# Patient Record
Sex: Female | Born: 1990 | Race: Black or African American | Hispanic: No | Marital: Single | Smoking: Never smoker
Health system: Southern US, Community
[De-identification: ages and names within clinical notes are randomized; demographics above are authoritative.]

## PROBLEM LIST (undated history)

## (undated) DIAGNOSIS — Z309 Encounter for contraceptive management, unspecified: Secondary | ICD-10-CM

## (undated) DIAGNOSIS — B9689 Other specified bacterial agents as the cause of diseases classified elsewhere: Secondary | ICD-10-CM

## (undated) DIAGNOSIS — N760 Acute vaginitis: Secondary | ICD-10-CM

## (undated) DIAGNOSIS — Z3490 Encounter for supervision of normal pregnancy, unspecified, unspecified trimester: Secondary | ICD-10-CM

## (undated) DIAGNOSIS — N898 Other specified noninflammatory disorders of vagina: Secondary | ICD-10-CM

## (undated) DIAGNOSIS — R87629 Unspecified abnormal cytological findings in specimens from vagina: Secondary | ICD-10-CM

## (undated) DIAGNOSIS — A549 Gonococcal infection, unspecified: Secondary | ICD-10-CM

## (undated) HISTORY — DX: Other specified noninflammatory disorders of vagina: N89.8

## (undated) HISTORY — DX: Encounter for supervision of normal pregnancy, unspecified, unspecified trimester: Z34.90

## (undated) HISTORY — DX: Other specified bacterial agents as the cause of diseases classified elsewhere: B96.89

## (undated) HISTORY — DX: Encounter for contraceptive management, unspecified: Z30.9

## (undated) HISTORY — DX: Unspecified abnormal cytological findings in specimens from vagina: R87.629

## (undated) HISTORY — DX: Gonococcal infection, unspecified: A54.9

## (undated) HISTORY — DX: Acute vaginitis: N76.0

## (undated) HISTORY — PX: KELOID EXCISION: SHX1856

---

## 2011-04-05 ENCOUNTER — Encounter (HOSPITAL_COMMUNITY): Payer: Self-pay | Admitting: *Deleted

## 2011-04-05 ENCOUNTER — Emergency Department (HOSPITAL_COMMUNITY): Payer: Self-pay

## 2011-04-05 ENCOUNTER — Emergency Department (HOSPITAL_COMMUNITY)
Admission: EM | Admit: 2011-04-05 | Discharge: 2011-04-05 | Disposition: A | Discharge status: Home / Self Care | Payer: Self-pay | Attending: Emergency Medicine | Admitting: Emergency Medicine

## 2011-04-05 DIAGNOSIS — W010XXA Fall on same level from slipping, tripping and stumbling without subsequent striking against object, initial encounter: Secondary | ICD-10-CM | POA: Insufficient documentation

## 2011-04-05 DIAGNOSIS — S63602A Unspecified sprain of left thumb, initial encounter: Secondary | ICD-10-CM

## 2011-04-05 DIAGNOSIS — S6390XA Sprain of unspecified part of unspecified wrist and hand, initial encounter: Secondary | ICD-10-CM | POA: Insufficient documentation

## 2011-04-05 DIAGNOSIS — M79609 Pain in unspecified limb: Secondary | ICD-10-CM | POA: Insufficient documentation

## 2011-04-05 NOTE — ED Notes (Signed)
Pt. Reported fell and landed on left hand and hurt thumb. She's put ice on the thumb.

## 2011-04-05 NOTE — ED Notes (Signed)
Patient transported to X-ray 

## 2011-04-05 NOTE — ED Notes (Signed)
Ortho called for a thumb spica

## 2011-04-05 NOTE — Discharge Instructions (Signed)
Your x-rays were normal and did not show signs of any broken bones. This is most likely a sprain of the ligaments of your thumb. We have placed in a splint to give you some extra support. Use this for the next week. You may use up to 800 mg ibuprofen 3 times a day for pain as needed. Take this with food. Continue using ice to the area and elevating the hand.If you're having persistent pain, trouble moving the thumb, or weakness in the thumb in 3-4 days please call and make a followup appointment with the orthopedic surgeon listed above.  Sprains Sprains are painful injuries to joints as a result of partial or complete tearing of ligaments. HOME CARE INSTRUCTIONS   For the first 24 hours, keep the injured limb raised on 2 pillows while lying down.   Apply ice bags about every 2 hours for 20 to 30 minutes, while awake, to the injured area for the first 24 hours. Then apply as directed by your caregiver. Place the ice in a plastic bag with a towel around it to prevent frostbite to the skin.   Only take over-the-counter or prescription medicines for pain, discomfort, or fever as directed by your caregiver.   If an ace bandage (a stretchy, elastic wrapping bandage) has been applied today, remove and reapply every 3 to 4 hours. Apply firm enough to keep swelling down. Donot apply tightly. Watch fingers or toes for swelling, bluish discoloration, coldness, numbness, or excessive pain. If any of these problems (symptoms) occur, remove the ace bandage and reapply it more loosely. Contact your caregiver or return to this location if these symptoms persist.  Persistent pain and inability to use the injured area for more than 2 to 3 days are warning signs. See a caregiver for a follow-up visit as soon as possible. A hairline fracture (broken bone) may not show on X-rays. Persistent pain and swelling indicate that further evaluation, use of crutches, and/or more X-rays are needed. X-rays may sometimes not show a  small fracture until a week or ten days later. Make a follow-up appointment with your own caregiver or to whom we have referred you. A specialist in reading X-rays(radiologist) will re-read your X-rays. Make sure you know how to obtain your X-ray results. Do not assume everything is normal if you do not hear from [place]. SEEK IMMEDIATE MEDICAL CARE IF:  You develop severe pain or more swelling.   The pain is not controlled with medicine.   Your skin or nails below the injury turn blue or grey or feel cold or numb.  Document Released: 12/23/1999 Document Revised: 12/14/2010 Document Reviewed: 08/11/2007 [facility] Patient Information 2012 Cheryl He[place], [place].

## 2011-04-05 NOTE — ED Provider Notes (Signed)
History     CSN: [id number]  Arrival date & time 04/05/11  1528   First MD Initiated Contact with Patient 04/05/11 1537      Chief Complaint  Patient presents with  . Hand Pain    (Consider location/radiation/quality/duration/timing/severity/associated sxs/prior treatment) HPI History from the patient. 21 year old female presents with some pain. She states that she slipped this afternoon he went to catch herself with her left hand. As she fell, she felt as if her thumb on the left hand was forcefully abducted. She has been applying ice to the area since the injury, but was having persistent pain to the dorsal surface of the base of the thumb. She is able to move the thumb but states that her range of motion is reduced secondary to pain. Denies any distal numbness. No history of injury to the wrist or hand.  History reviewed. No pertinent past medical history.  History reviewed. No pertinent past surgical history.  No family history on file.  History  Substance Use Topics  . Smoking status: Never Smoker   . Smokeless tobacco: Not on file  . Alcohol Use: No    OB History    Grav Para Term Preterm Abortions TAB SAB Ect Mult Living                  Review of Systems  Constitutional: Negative.   Musculoskeletal: Positive for arthralgias. Negative for myalgias and joint swelling.  Skin: Negative for color change and wound.  Neurological: Negative for weakness and numbness.    Allergies  Review of patient's allergies indicates no known allergies.  Home Medications   Current Outpatient Rx  Name Route Sig Dispense Refill  . IBUPROFEN 200 MG PO TABS Oral Take 400 mg by mouth every 6 (six) hours as needed.    . ADULT MULTIVITAMIN W/MINERALS CH Oral Take 1 tablet by mouth daily.    Marland Kitchen NORGESTREL-ETHINYL ESTRADIOL 0.3-30 MG-MCG PO TABS Oral Take 1 tablet by mouth daily.      BP 118/86  Pulse 89  Temp 98.4 F (36.9 C)  Resp 24  Ht 5\' 7"  (1.702 m)  Wt 118 lb (53.524 kg)   BMI 18.48 kg/m2  SpO2 100%  LMP 03/28/2011  Physical Exam  Nursing note and vitals reviewed. Constitutional: She appears well-developed and well-nourished. No distress.  HENT:  Head: Normocephalic and atraumatic.  Musculoskeletal:       Right hand: She exhibits bony tenderness. She exhibits normal range of motion, normal capillary refill, no deformity, no laceration and no swelling. normal sensation noted. Decreased sensation is not present in the ulnar distribution, is not present in the medial distribution and is not present in the radial distribution.       Hands:      Pt unable to cooperate with strength testing 2/2 pain but does have FROM. No gross ligamentous laxity on passive ROM. Mildly ttp to MCP jt, not notably tender to snuffbox.  Neurological: She is alert.  Skin: Skin is warm and dry. No rash noted. She is not diaphoretic.  Psychiatric: She has a normal mood and affect.    ED Course  Procedures (including critical care time)  Labs Reviewed - No data to display Dg Hand Complete Left  04/05/2011  *RADIOLOGY REPORT*  Clinical Data: Forced abduction of the thumb.  Pain.  LEFT HAND - COMPLETE 3+ VIEW  Comparison: None.  Findings: Imaged bones, joints and soft tissues appear normal.  IMPRESSION: Negative exam.  Original Report Authenticated  By: Bernadene Bell D'ALESSIO, M.D.     1. Left thumb sprain       MDM  Negative xrays. No notable ligamentous laxity on exam. Unable to attain thorough assessment of strength 2/2 decreased cooperation 2/2 pain. Plan to place in thumb spica and have her f/u with hand in 3-4 days if persistent pain or reduced ROM. Instructed to cont RICE, take ibuprofen for pain. Return precautions discussed. Pt verbalized understanding and was agreeable to plan.        Grant Fontana, [place] 04/05/11 1711

## 2011-04-06 NOTE — ED Provider Notes (Signed)
Medical screening examination/treatment/procedure(s) were performed by non-physician practitioner and as supervising physician I was immediately available for consultation/collaboration.   Eileene Kisling, MD 04/06/11 0020 

## 2013-02-12 IMAGING — CR DG HAND COMPLETE 3+V*L*
4 series · 4 of 4 positions shown · non-contrast
Comparison: None.

CLINICAL DATA: Forced abduction of the thumb.  Pain.

LEFT HAND - COMPLETE 3+ VIEW

[x hand pa left]
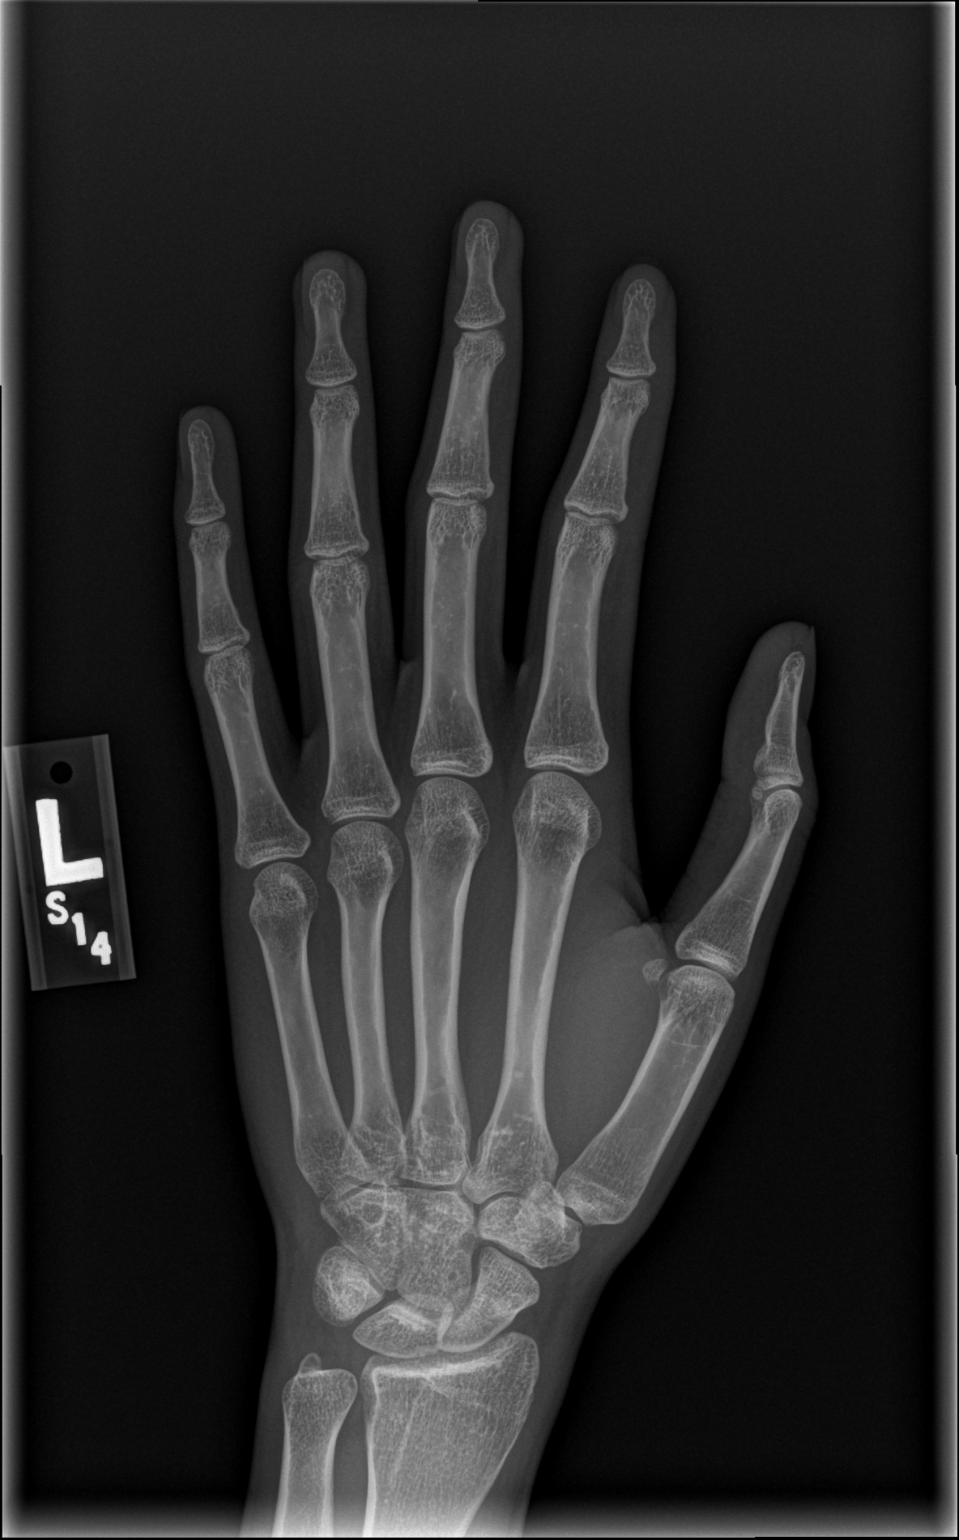

[x hand obl left]
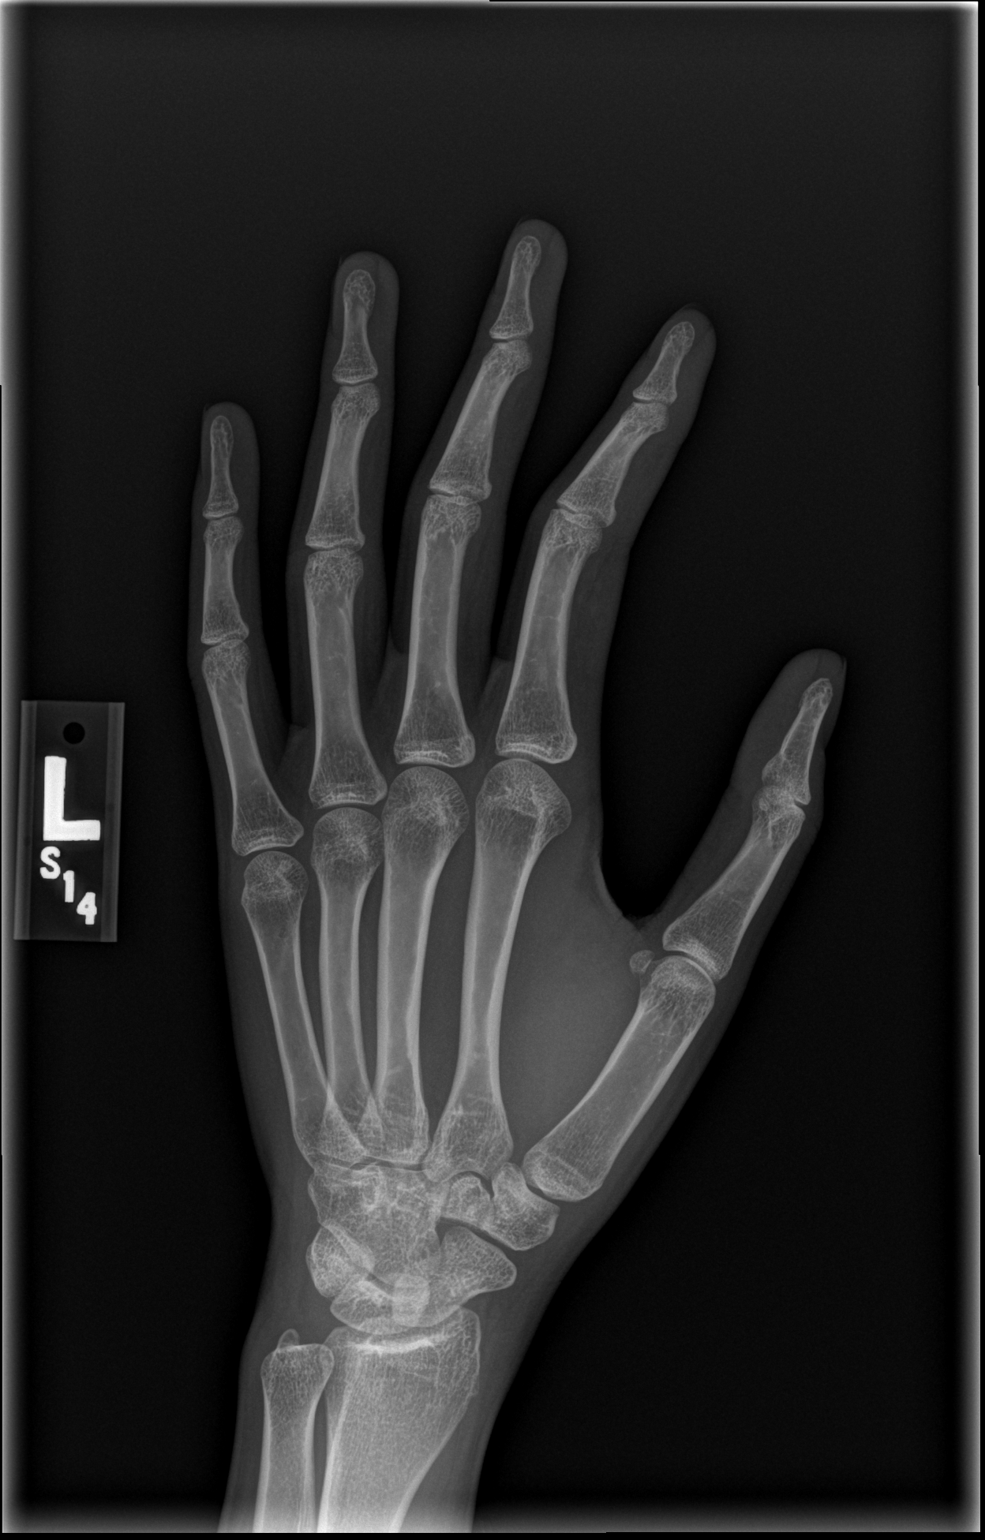

[x hand lat left (1 of 2)]
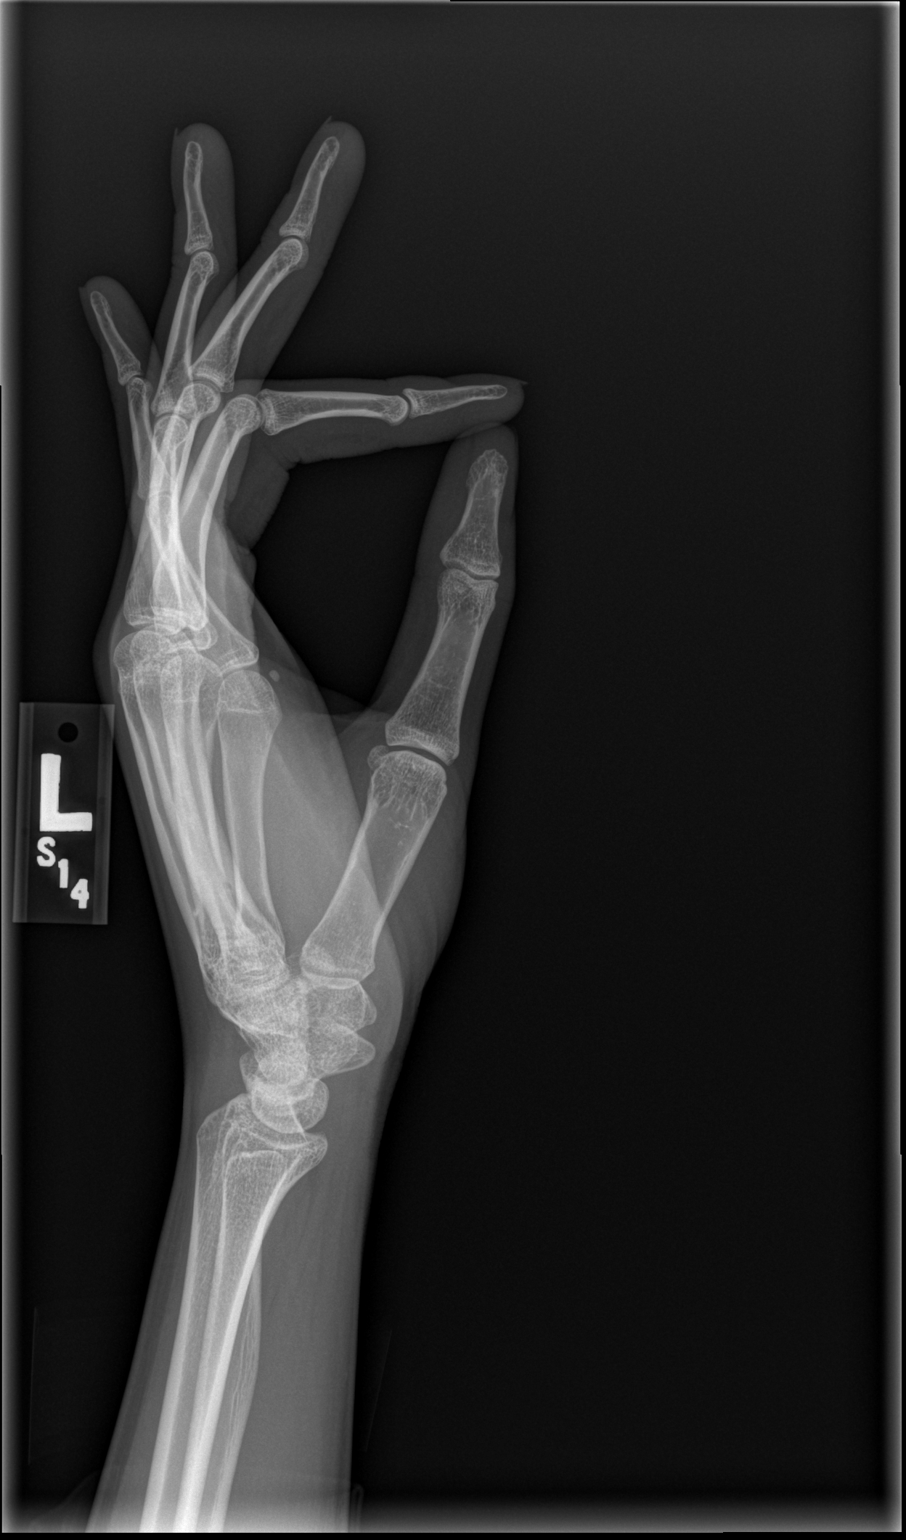

[x hand lat left (2 of 2)]
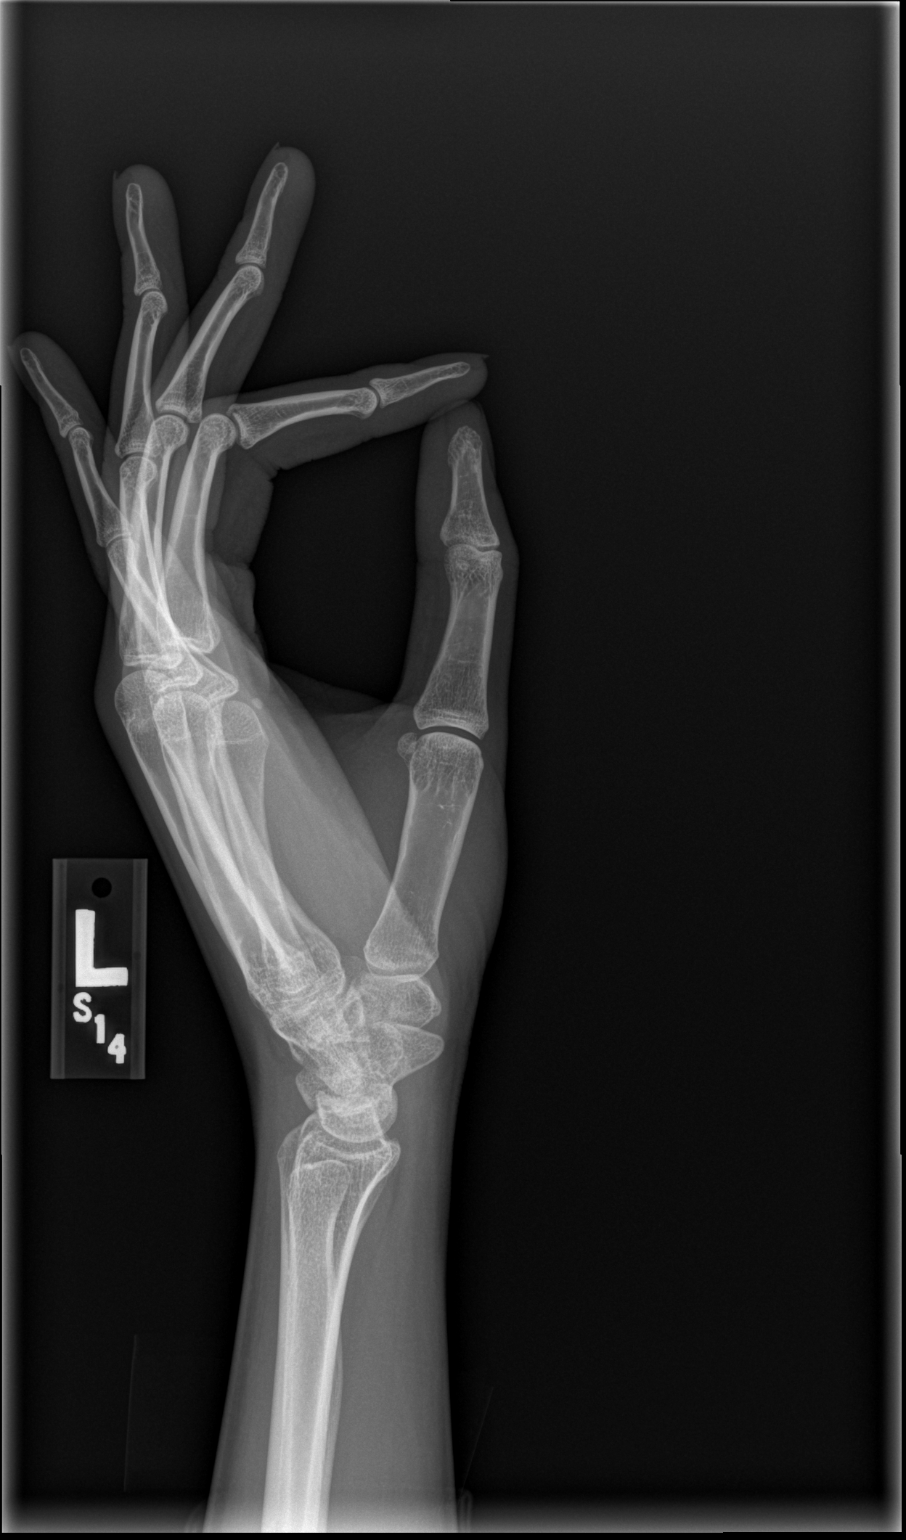

[4 of 4 positions shown; findings below may reference images not displayed]

FINDINGS: Imaged bones, joints and soft tissues appear normal.
IMPRESSION: Negative exam.

## 2013-11-28 ENCOUNTER — Emergency Department (HOSPITAL_COMMUNITY)
Admission: EM | Admit: 2013-11-28 | Discharge: 2013-11-29 | Disposition: A | Discharge status: Home / Self Care | Payer: 59 | Attending: Emergency Medicine | Admitting: Emergency Medicine

## 2013-11-28 ENCOUNTER — Encounter (HOSPITAL_COMMUNITY): Payer: Self-pay | Admitting: Emergency Medicine

## 2013-11-28 DIAGNOSIS — L7621 Postprocedural hemorrhage and hematoma of skin and subcutaneous tissue following a dermatologic procedure: Secondary | ICD-10-CM | POA: Diagnosis present

## 2013-11-28 DIAGNOSIS — Z79899 Other long term (current) drug therapy: Secondary | ICD-10-CM | POA: Insufficient documentation

## 2013-11-28 DIAGNOSIS — S01302A Unspecified open wound of left ear, initial encounter: Secondary | ICD-10-CM

## 2013-11-28 NOTE — ED Notes (Signed)
Pt had keloid surgery on left ear on Monday. Reports this evening it "started pouring blood". Bandage on ear. Bleeding appears controlled at present.

## 2013-11-28 NOTE — ED Provider Notes (Signed)
CSN: [id number]     Arrival date & time 11/28/13  2340 History   This chart was scribed for Joya Gaskinsonald W Damia Bobrowski, MD by Evon Slackerrance Branch, ED Scribe. This patient was seen in room APA11/APA11 and the patient's care was started at 11:53 PM.    Chief Complaint  Patient presents with  . Ear Injury   The history is provided by the patient. No language interpreter was used.   HPI Comments: Cheryl Hebert is a 23 y.o. female who presents to the Emergency Department complaining of left ear bleeding.  She states that she recently had a keloid removed 5 days ago. She states that the ear started bleeding around 11 PM tonight. She denies any medications PTA. She denies any other injuries or ear pain. Pt states she is scheduled to follow up with surgeon on 12/07/13.   PMH - none Past Surgical History  Procedure Laterality Date  . Keloid excision     No family history on file. History  Substance Use Topics  . Smoking status: Never Smoker   . Smokeless tobacco: Not on file  . Alcohol Use: No   OB History    No data available     Review of Systems  Constitutional: Negative for fever.  Skin: Positive for wound.   Allergies  Review of patient's allergies indicates no known allergies.  Home Medications   Prior to Admission medications   Medication Sig Start Date End Date Taking? Authorizing Provider  ibuprofen (ADVIL,MOTRIN) 200 MG tablet Take 400 mg by mouth every 6 (six) hours as needed.    Historical Provider, MD  Multiple Vitamin (MULITIVITAMIN WITH MINERALS) TABS Take 1 tablet by mouth daily.    Historical Provider, MD  norgestrel-ethinyl estradiol (LO/OVRAL,CRYSELLE) 0.3-30 MG-MCG tablet Take 1 tablet by mouth daily.    Historical Provider, MD   Triage Vitals: BP 134/86 mmHg  Pulse 81  Temp(Src) 98 F (36.7 C) (Oral)  Resp 20  Ht 5\' 7"  (1.702 m)  Wt 117 lb (53.071 kg)  BMI 18.32 kg/m2  SpO2 100%  LMP 11/04/2013  Physical Exam  Nursing note and vitals reviewed.  CONSTITUTIONAL:  Well developed/well nourished HEAD: Normocephalic/atraumatic EYES: EOMI/PERRL ENMT: Mucous membranes moist, left ear wound noted to helix no active bleeding noted wound is healing well, no discharge/erythema noted NECK: supple no meningeal signs CV: S1/S2 noted, no murmurs/rubs/gallops noted LUNGS: Lungs are clear to auscultation bilaterally, no apparent distress ABDOMEN: soft, nontender, no rebound or guarding, bowel sounds noted throughout abdomen NEURO: Pt is awake/alert/appropriate, moves all extremitiesx4.  No facial droop.   EXTREMITIES: pulses normal/equal, full ROM SKIN: warm, color normal PSYCH: no abnormalities of mood noted, alert and oriented to situation   ED Course  Procedures DIAGNOSTIC STUDIES: Oxygen Saturation is 100% on RA, normal by my interpretation.    COORDINATION OF CARE: 12:17 AM-Discussed treatment plan with pt at bedside and pt agreed to plan.  No active bleeding.  No signs of wound infection.  Pt is well appearing Will place small piece of xeroform with bandage.  She will call her surgeon in 2 days     MDM   Final diagnoses:  Open wound of left ear, initial encounter       Nursing notes including past medical history and social history reviewed and considered in documentation   I personally performed the services described in this documentation, which was scribed in my presence. The recorded information has been reviewed and is accurate.      Suella Groveonald W  Bebe ShaggyWickline, MD 11/29/13 0040

## 2013-11-29 NOTE — ED Notes (Signed)
Advised pt to leave ace wrap on until this afternoon. Then gently remove wrap, leave any remaining cellulose fiber in place and cover with loose gauze. (demonstrated how and pt understands)

## 2013-11-29 NOTE — ED Notes (Signed)
Bleeding thru pressure dressing.

## 2015-02-28 ENCOUNTER — Encounter: Payer: Self-pay | Admitting: Adult Health

## 2015-02-28 ENCOUNTER — Ambulatory Visit (INDEPENDENT_AMBULATORY_CARE_PROVIDER_SITE_OTHER): Payer: 59 | Admitting: Adult Health

## 2015-02-28 ENCOUNTER — Other Ambulatory Visit (HOSPITAL_COMMUNITY)
Admission: RE | Admit: 2015-02-28 | Discharge: 2015-02-28 | Disposition: A | Discharge status: Home / Self Care | Payer: 59 | Source: Ambulatory Visit | Attending: Adult Health | Admitting: Adult Health

## 2015-02-28 VITALS — BP 110/78 | HR 82 | Ht 67.0 in | Wt 116.0 lb

## 2015-02-28 DIAGNOSIS — Z3009 Encounter for other general counseling and advice on contraception: Secondary | ICD-10-CM

## 2015-02-28 DIAGNOSIS — Z01419 Encounter for gynecological examination (general) (routine) without abnormal findings: Secondary | ICD-10-CM

## 2015-02-28 DIAGNOSIS — Z30011 Encounter for initial prescription of contraceptive pills: Secondary | ICD-10-CM

## 2015-02-28 DIAGNOSIS — Z309 Encounter for contraceptive management, unspecified: Secondary | ICD-10-CM

## 2015-02-28 DIAGNOSIS — B9689 Other specified bacterial agents as the cause of diseases classified elsewhere: Secondary | ICD-10-CM

## 2015-02-28 DIAGNOSIS — Z3202 Encounter for pregnancy test, result negative: Secondary | ICD-10-CM | POA: Diagnosis not present

## 2015-02-28 DIAGNOSIS — N898 Other specified noninflammatory disorders of vagina: Secondary | ICD-10-CM

## 2015-02-28 DIAGNOSIS — N760 Acute vaginitis: Secondary | ICD-10-CM | POA: Diagnosis not present

## 2015-02-28 DIAGNOSIS — Z113 Encounter for screening for infections with a predominantly sexual mode of transmission: Secondary | ICD-10-CM | POA: Insufficient documentation

## 2015-02-28 HISTORY — DX: Other specified bacterial agents as the cause of diseases classified elsewhere: B96.89

## 2015-02-28 HISTORY — DX: Other specified noninflammatory disorders of vagina: N89.8

## 2015-02-28 HISTORY — DX: Encounter for contraceptive management, unspecified: Z30.9

## 2015-02-28 LAB — POCT URINE PREGNANCY: PREG TEST UR: NEGATIVE

## 2015-02-28 LAB — POCT WET PREP (WET MOUNT): CLUE CELLS WET PREP WHIFF POC: NEGATIVE

## 2015-02-28 MED ORDER — NORETHIN-ETH ESTRAD-FE BIPHAS 1 MG-10 MCG / 10 MCG PO TABS: 1.0000 | ORAL_TABLET | Freq: Every day | ORAL | Status: DC

## 2015-02-28 MED ORDER — METRONIDAZOLE 500 MG PO TABS: 500.0000 mg | ORAL_TABLET | Freq: Two times a day (BID) | ORAL | Status: DC

## 2015-02-28 NOTE — Patient Instructions (Signed)
Bacterial Vaginosis Bacterial vaginosis is a vaginal infection that occurs when the normal balance of bacteria in the vagina is disrupted. It results from an overgrowth of certain bacteria. This is the most common vaginal infection in women of childbearing age. Treatment is important to prevent complications, especially in pregnant women, as it can cause a premature delivery. CAUSES  Bacterial vaginosis is caused by an increase in harmful bacteria that are normally present in smaller amounts in the vagina. Several different kinds of bacteria can cause bacterial vaginosis. However, the reason that the condition develops is not fully understood. RISK FACTORS Certain activities or behaviors can put you at an increased risk of developing bacterial vaginosis, including:  Having a new sex partner or multiple sex partners.  Douching.  Using an intrauterine device (IUD) for contraception. Women do not get bacterial vaginosis from toilet seats, bedding, swimming pools, or contact with objects around them. SIGNS AND SYMPTOMS  Some women with bacterial vaginosis have no signs or symptoms. Common symptoms include:  Grey vaginal discharge.  A fishlike odor with discharge, especially after sexual intercourse.  Itching or burning of the vagina and vulva.  Burning or pain with urination. DIAGNOSIS  Your health care provider will take a medical history and examine the vagina for signs of bacterial vaginosis. A sample of vaginal fluid may be taken. Your health care provider will look at this sample under a microscope to check for bacteria and abnormal cells. A vaginal pH test may also be done.  TREATMENT  Bacterial vaginosis may be treated with antibiotic medicines. These may be given in the form of a pill or a vaginal cream. A second round of antibiotics may be prescribed if the condition comes back after treatment. Because bacterial vaginosis increases your risk for sexually transmitted diseases, getting  treated can help reduce your risk for chlamydia, gonorrhea, HIV, and herpes. HOME CARE INSTRUCTIONS   Only take over-the-counter or prescription medicines as directed by your health care provider.  If antibiotic medicine was prescribed, take it as directed. Make sure you finish it even if you start to feel better.  Tell all sexual partners that you have a vaginal infection. They should see their health care provider and be treated if they have problems, such as a mild rash or itching.  During treatment, it is important that you follow these instructions:  Avoid sexual activity or use condoms correctly.  Do not douche.  Avoid alcohol as directed by your health care provider.  Avoid breastfeeding as directed by your health care provider. SEEK MEDICAL CARE IF:   Your symptoms are not improving after 3 days of treatment.  You have increased discharge or pain.  You have a fever. MAKE SURE YOU:   Understand these instructions.  Will watch your condition.  Will get help right away if you are not doing well or get worse. FOR MORE INFORMATION  Centers for Disease Control and Prevention, Division of STD Prevention: [employer] American Sexual Health Association (ASHA): www.ashastd.org    This information is not intended to replace advice given to you by your health care provider. Make sure you discuss any questions you have with your health care provider.   Document Released: 12/25/2004 Document Revised: 01/15/2014 Document Reviewed: 08/06/2012 Elsevier Interactive Patient Education 2016 [employer]. No alcohol Start lo loestrin Use condoms for 4 weeks at least Physical in 1 year

## 2015-02-28 NOTE — Progress Notes (Signed)
Patient ID: Cheryl Hebert, female   DOB: Aug 18, 1990, 25 y.o.   MRN: [id number] History of Present Illness: Cheryl Hebert is a 25 year old black female in for a well woman gyn exam and pap and complains of vaginal discharge, was treated for BV several weeks ago, and she wants to start OCs.   Current Medications, Allergies, Past Medical History, Past Surgical History, Family History and Social History were reviewed in [employer] record.     Review of Systems: Patient denies any headaches, hearing loss, fatigue, blurred vision, shortness of breath, chest pain, abdominal pain, problems with bowel movements, urination, or intercourse. No joint pain or mood swings.See HPI for positives.    Physical Exam:BP 110/78 mmHg  Pulse 82  Ht  (1.702 m)  Wt 116 lb (52.617 kg)  BMI 18.16 kg/m2  LMP 02/20/2015 UPT negative General:  Well developed, well nourished, no acute distress Skin:  Warm and dry Neck:  Midline trachea, normal thyroid, good ROM, no lymphadenopathy Lungs; Clear to auscultation bilaterally Breast:  No dominant palpable mass, retraction, or nipple discharge Cardiovascular: Regular rate and rhythm Abdomen:  Soft, non tender, no hepatosplenomegaly Pelvic:  External genitalia is normal in appearance, no lesions.  The vagina is normal in appearance. Urethra has no lesions or masses. The cervix is tiny, pap with GC/CHL performed and as was wet prep, which showed + WBCs and + clue cells.Uterus NSSC non tender, and adnexa had no masses or tenderness noted, bladder non tender. Extremities/musculoskeletal:  No swelling or varicosities noted, no clubbing or cyanosis Psych:  No mood changes, alert and cooperative,seems happy Discussed has BV, and reviewed some causes.  Impression: Well woman gyn exam and pap Family planning  Vaginal discharge BV  Contraceptive management     Plan: Rx lo loestrin  take 1 daily with 11 refills, start today, use condoms Check HIV,HSV 2  and RPR  Physical in 1 year, pap in 3 if normal Rx flagyl 500 mg 1 bid x 7 days, no alcohol, review handout on BV

## 2015-03-01 LAB — CYTOLOGY - PAP

## 2015-03-01 LAB — RPR: RPR: NONREACTIVE

## 2015-03-01 LAB — HSV 2 ANTIBODY, IGG

## 2015-03-01 LAB — HIV ANTIBODY (ROUTINE TESTING W REFLEX): HIV SCREEN 4TH GENERATION: NONREACTIVE

## 2015-03-02 ENCOUNTER — Telehealth: Payer: Self-pay | Admitting: Adult Health

## 2015-03-02 MED ORDER — FLUCONAZOLE 150 MG PO TABS: ORAL_TABLET | ORAL | Status: DC

## 2015-03-02 NOTE — Telephone Encounter (Signed)
Left message pap normal with negative GC/CHl but + yeast will call diflucan in

## 2015-06-01 ENCOUNTER — Ambulatory Visit (INDEPENDENT_AMBULATORY_CARE_PROVIDER_SITE_OTHER): Payer: 59 | Admitting: Adult Health

## 2015-06-01 ENCOUNTER — Encounter: Payer: Self-pay | Admitting: Adult Health

## 2015-06-01 VITALS — BP 108/70 | HR 74 | Ht 67.0 in | Wt 112.0 lb

## 2015-06-01 DIAGNOSIS — N644 Mastodynia: Secondary | ICD-10-CM

## 2015-06-01 DIAGNOSIS — N926 Irregular menstruation, unspecified: Secondary | ICD-10-CM

## 2015-06-01 DIAGNOSIS — Z3201 Encounter for pregnancy test, result positive: Secondary | ICD-10-CM

## 2015-06-01 DIAGNOSIS — O3680X0 Pregnancy with inconclusive fetal viability, not applicable or unspecified: Secondary | ICD-10-CM

## 2015-06-01 DIAGNOSIS — R252 Cramp and spasm: Secondary | ICD-10-CM

## 2015-06-01 DIAGNOSIS — Z3490 Encounter for supervision of normal pregnancy, unspecified, unspecified trimester: Secondary | ICD-10-CM

## 2015-06-01 HISTORY — DX: Encounter for supervision of normal pregnancy, unspecified, unspecified trimester: Z34.90

## 2015-06-01 LAB — POCT URINE PREGNANCY: Preg Test, Ur: POSITIVE — AB

## 2015-06-01 MED ORDER — PRENATAL PLUS 27-1 MG PO TABS
1.0000 | ORAL_TABLET | Freq: Every day | ORAL | Status: DC
Start: 2015-06-01 — End: 2015-06-21

## 2015-06-01 NOTE — Progress Notes (Signed)
Subjective:     Patient ID: Cheryl Hebert, female   DOB: 07/14/1990, 25 y.o.   MRN: [id number]  HPI Cheryl Hebert is a 25 year old black female in for UPT, she had a +HPT, after missing a period and +breast tenderness and some cramping, no bleeding, She only took OCs about a month then stopped, she was skipping pills.   Review of Systems Patient denies any headaches, hearing loss, fatigue, blurred vision, shortness of breath, chest pain, problems with bowel movements, urination, or intercourse. No joint pain or mood swings.See HPI for positives. Reviewed past medical,surgical, social and family history. Reviewed medications and allergies.     Objective:   Physical Exam BP 108/70 mmHg  Pulse 74  Ht 5\' 7"  (1.702 m)  Wt 112 lb (50.803 kg)  BMI 17.54 kg/m2  LMP 04/28/2015 UPT + about 4+6 weeks by LMP with EDD 02/02/16, Skin warm and dry. Neck: mid line trachea, normal thyroid, good ROM, no lymphadenopathy noted. Lungs: clear to ausculation bilaterally. Cardiovascular: regular rate and rhythm.Abdomen soft and non tender.Medicaid form given    Assessment:    UPT + Pregnant     Plan:      Rx prenatal plus #30 take 1 daily with 11 refills Return next week for dating US Review handout on first trimester

## 2015-06-01 NOTE — Patient Instructions (Signed)
First Trimester of Pregnancy The first trimester of pregnancy is from week 1 until the end of week 12 (months 1 through 3). A week after a sperm fertilizes an egg, the egg will implant on the wall of the uterus. This embryo will begin to develop into a baby. Genes from you and your partner are forming the baby. The female genes determine whether the baby is a boy or a girl. At 6-8 weeks, the eyes and face are formed, and the heartbeat can be seen on ultrasound. At the end of 12 weeks, all the baby's organs are formed.  Now that you are pregnant, you will want to do everything you can to have a healthy baby. Two of the most important things are to get good prenatal care and to follow your health care provider's instructions. Prenatal care is all the medical care you receive before the baby's birth. This care will help prevent, find, and treat any problems during the pregnancy and childbirth. BODY CHANGES Your body goes through many changes during pregnancy. The changes vary from woman to woman.   You may gain or lose a couple of pounds at first.  You may feel sick to your stomach (nauseous) and throw up (vomit). If the vomiting is uncontrollable, call your health care provider.  You may tire easily.  You may develop headaches that can be relieved by medicines approved by your health care provider.  You may urinate more often. Painful urination may mean you have a bladder infection.  You may develop heartburn as a result of your pregnancy.  You may develop constipation because certain hormones are causing the muscles that push waste through your intestines to slow down.  You may develop hemorrhoids or swollen, bulging veins (varicose veins).  Your breasts may begin to grow larger and become tender. Your nipples may stick out more, and the tissue that surrounds them (areola) may become darker.  Your gums may bleed and may be sensitive to brushing and flossing.  Dark spots or blotches (chloasma,  mask of pregnancy) may develop on your face. This will likely fade after the baby is born.  Your menstrual periods will stop.  You may have a loss of appetite.  You may develop cravings for certain kinds of food.  You may have changes in your emotions from day to day, such as being excited to be pregnant or being concerned that something may go wrong with the pregnancy and baby.  You may have more vivid and strange dreams.  You may have changes in your hair. These can include thickening of your hair, rapid growth, and changes in texture. Some women also have hair loss during or after pregnancy, or hair that feels dry or thin. Your hair will most likely return to normal after your baby is born. WHAT TO EXPECT AT YOUR PRENATAL VISITS During a routine prenatal visit:  You will be weighed to make sure you and the baby are growing normally.  Your blood pressure will be taken.  Your abdomen will be measured to track your baby's growth.  The fetal heartbeat will be listened to starting around week 10 or 12 of your pregnancy.  Test results from any previous visits will be discussed. Your health care provider may ask you:  How you are feeling.  If you are feeling the baby move.  If you have had any abnormal symptoms, such as leaking fluid, bleeding, severe headaches, or abdominal cramping.  If you are using any tobacco products,   including cigarettes, chewing tobacco, and electronic cigarettes.  If you have any questions. Other tests that may be performed during your first trimester include:  Blood tests to find your blood type and to check for the presence of any previous infections. They will also be used to check for low iron levels (anemia) and Rh antibodies. Later in the pregnancy, blood tests for diabetes will be done along with other tests if problems develop.  Urine tests to check for infections, diabetes, or protein in the urine.  An ultrasound to confirm the proper growth  and development of the baby.  An amniocentesis to check for possible genetic problems.  Fetal screens for spina bifida and Down syndrome.  You may need other tests to make sure you and the baby are doing well.  HIV (human immunodeficiency virus) testing. Routine prenatal testing includes screening for HIV, unless you choose not to have this test. HOME CARE INSTRUCTIONS  Medicines  Follow your health care provider's instructions regarding medicine use. Specific medicines may be either safe or unsafe to take during pregnancy.  Take your prenatal vitamins as directed.  If you develop constipation, try taking a stool softener if your health care provider approves. Diet  Eat regular, well-balanced meals. Choose a variety of foods, such as meat or vegetable-based protein, fish, milk and low-fat dairy products, vegetables, fruits, and whole grain breads and cereals. Your health care provider will help you determine the amount of weight gain that is right for you.  Avoid raw meat and uncooked cheese. These carry germs that can cause birth defects in the baby.  Eating four or five small meals rather than three large meals a day may help relieve nausea and vomiting. If you start to feel nauseous, eating a few soda crackers can be helpful. Drinking liquids between meals instead of during meals also seems to help nausea and vomiting.  If you develop constipation, eat more high-fiber foods, such as fresh vegetables or fruit and whole grains. Drink enough fluids to keep your urine clear or pale yellow. Activity and Exercise  Exercise only as directed by your health care provider. Exercising will help you:  Control your weight.  Stay in shape.  Be prepared for labor and delivery.  Experiencing pain or cramping in the lower abdomen or low back is a good sign that you should stop exercising. Check with your health care provider before continuing normal exercises.  Try to avoid standing for long  periods of time. Move your legs often if you must stand in one place for a long time.  Avoid heavy lifting.  Wear low-heeled shoes, and practice good posture.  You may continue to have sex unless your health care provider directs you otherwise. Relief of Pain or Discomfort  Wear a good support bra for breast tenderness.   Take warm sitz baths to soothe any pain or discomfort caused by hemorrhoids. Use hemorrhoid cream if your health care provider approves.   Rest with your legs elevated if you have leg cramps or low back pain.  If you develop varicose veins in your legs, wear support hose. Elevate your feet for 15 minutes, 3-4 times a day. Limit salt in your diet. Prenatal Care  Schedule your prenatal visits by the twelfth week of pregnancy. They are usually scheduled monthly at first, then more often in the last 2 months before delivery.  Write down your questions. Take them to your prenatal visits.  Keep all your prenatal visits as directed by your   health care provider. Safety  Wear your seat belt at all times when driving.  Make a list of emergency phone numbers, including numbers for family, friends, the hospital, and police and fire departments. General Tips  Ask your health care provider for a referral to a local prenatal education class. Begin classes no later than at the beginning of month 6 of your pregnancy.  Ask for help if you have counseling or nutritional needs during pregnancy. Your health care provider can offer advice or refer you to specialists for help with various needs.  Do not use hot tubs, steam rooms, or saunas.  Do not douche or use tampons or scented sanitary pads.  Do not cross your legs for long periods of time.  Avoid cat litter boxes and soil used by cats. These carry germs that can cause birth defects in the baby and possibly loss of the fetus by miscarriage or stillbirth.  Avoid all smoking, herbs, alcohol, and medicines not prescribed by  your health care provider. Chemicals in these affect the formation and growth of the baby.  Do not use any tobacco products, including cigarettes, chewing tobacco, and electronic cigarettes. If you need help quitting, ask your health care provider. You may receive counseling support and other resources to help you quit.  Schedule a dentist appointment. At home, brush your teeth with a soft toothbrush and be gentle when you floss. SEEK MEDICAL CARE IF:   You have dizziness.  You have mild pelvic cramps, pelvic pressure, or nagging pain in the abdominal area.  You have persistent nausea, vomiting, or diarrhea.  You have a bad smelling vaginal discharge.  You have pain with urination.  You notice increased swelling in your face, hands, legs, or ankles. SEEK IMMEDIATE MEDICAL CARE IF:   You have a fever.  You are leaking fluid from your vagina.  You have spotting or bleeding from your vagina.  You have severe abdominal cramping or pain.  You have rapid weight gain or loss.  You vomit blood or material that looks like coffee grounds.  You are exposed to [place] measles and have never had them.  You are exposed to fifth disease or chickenpox.  You develop a severe headache.  You have shortness of breath.  You have any kind of trauma, such as from a fall or a car accident.   This information is not intended to replace advice given to you by your health care provider. Make sure you discuss any questions you have with your health care provider.   Document Released: 12/19/2000 Document Revised: 01/15/2014 Document Reviewed: 11/04/2012 Elsevier Interactive Patient Education [employer]2016 [employer] Inc. Return next week for dating [place]

## 2015-06-08 ENCOUNTER — Telehealth: Payer: Self-pay | Admitting: Women's Health

## 2015-06-08 NOTE — Telephone Encounter (Signed)
Pt c/o cramping, no vaginal bleeding. Pt informed to push water, take Tylenol. If no improvement call our office back. Pt verbalized understanding.

## 2015-06-10 ENCOUNTER — Ambulatory Visit (INDEPENDENT_AMBULATORY_CARE_PROVIDER_SITE_OTHER): Payer: 59

## 2015-06-10 DIAGNOSIS — O3680X0 Pregnancy with inconclusive fetal viability, not applicable or unspecified: Secondary | ICD-10-CM | POA: Diagnosis not present

## 2015-06-10 NOTE — Progress Notes (Signed)
Dating US today at 6+[redacted] weeks GA.  Single IUP with FHR 120 bpm.  CRL measures 5.1 mm which is consistent with LMP dating. Bilateral ovaries appear normal.

## 2015-06-21 ENCOUNTER — Encounter: Payer: Self-pay | Admitting: Women's Health

## 2015-06-21 ENCOUNTER — Ambulatory Visit (INDEPENDENT_AMBULATORY_CARE_PROVIDER_SITE_OTHER): Payer: 59 | Admitting: Women's Health

## 2015-06-21 VITALS — BP 112/66 | HR 80 | Wt 114.0 lb

## 2015-06-21 DIAGNOSIS — Z331 Pregnant state, incidental: Secondary | ICD-10-CM | POA: Diagnosis not present

## 2015-06-21 DIAGNOSIS — Z3490 Encounter for supervision of normal pregnancy, unspecified, unspecified trimester: Secondary | ICD-10-CM | POA: Insufficient documentation

## 2015-06-21 DIAGNOSIS — Z3682 Encounter for antenatal screening for nuchal translucency: Secondary | ICD-10-CM

## 2015-06-21 DIAGNOSIS — Z0283 Encounter for blood-alcohol and blood-drug test: Secondary | ICD-10-CM

## 2015-06-21 DIAGNOSIS — Z0189 Encounter for other specified special examinations: Secondary | ICD-10-CM

## 2015-06-21 DIAGNOSIS — Z369 Encounter for antenatal screening, unspecified: Secondary | ICD-10-CM

## 2015-06-21 DIAGNOSIS — Z3491 Encounter for supervision of normal pregnancy, unspecified, first trimester: Secondary | ICD-10-CM

## 2015-06-21 DIAGNOSIS — Z1389 Encounter for screening for other disorder: Secondary | ICD-10-CM | POA: Diagnosis not present

## 2015-06-21 DIAGNOSIS — Z36 Encounter for antenatal screening of mother: Secondary | ICD-10-CM

## 2015-06-21 LAB — POCT URINALYSIS DIPSTICK
Blood, UA: NEGATIVE
GLUCOSE UA: NEGATIVE
Ketones, UA: NEGATIVE
NITRITE UA: NEGATIVE
Protein, UA: NEGATIVE

## 2015-06-21 MED ORDER — PRENATAL PLUS 27-1 MG PO TABS
1.0000 | ORAL_TABLET | Freq: Every day | ORAL | Status: DC
Start: 2015-06-21 — End: 2018-03-04

## 2015-06-21 NOTE — Patient Instructions (Signed)

## 2015-06-21 NOTE — Progress Notes (Signed)
  Subjective:  Cheryl Hebert is a [age] y.o. [id number] African American female at 6980w5d by LMP c/w 6wk u/s, being seen today for her first obstetrical visit.  Her obstetrical history is significant for EAB x 2.  Pregnancy history fully reviewed.  Patient reports nausea- has only vomited x 1-declines meds at this time. Denies vb, cramping, uti s/s, abnormal/malodorous vag d/c, or vulvovaginal itching/irritation.  BP 112/66 mmHg  Pulse 80  Wt 114 lb (51.71 kg)  LMP 04/28/2015  HISTORY: OB History  Gravida Para Term Preterm AB SAB TAB Ectopic Multiple Living  3    2  2    0    # Outcome Date GA Lbr Len/2nd Weight Sex Delivery Anes PTL Lv  3 Current           2 TAB           1 TAB              Past Medical History  Diagnosis Date  . Vaginal discharge 02/28/2015  . BV (bacterial vaginosis) 02/28/2015  . Contraceptive management 02/28/2015  . Pregnant 06/01/2015   Past Surgical History  Procedure Laterality Date  . Keloid excision     Family History  Problem Relation Age of Onset  . Asthma Brother   . Cancer Maternal Grandfather   . Diabetes Maternal Aunt     Exam   System:     General: Well developed & nourished, no acute distress   Skin: Warm & dry, normal coloration and turgor, no rashes   Neurologic: Alert & oriented, normal mood   Cardiovascular: Regular rate & rhythm   Respiratory: Effort & rate normal, LCTAB, acyanotic   Abdomen: Soft, non tender   Extremities: normal strength, tone  Thin prep pap smear neg 2016    Assessment:   Pregnancy: G3P0020 Patient Active Problem List   Diagnosis Date Noted  . Supervision of normal pregnancy 06/21/2015    Priority: High  . BV (bacterial vaginosis) 02/28/2015    2680w5d G3P0020 New OB visit Nausea in pregnancy  Plan:  Initial labs drawn Rx sent for pnv, states pharmacy said they didn't get last rx Problem list reviewed and updated Reviewed n/v relief measures and warning s/s to report Reviewed recommended weight gain  based on pre-gravid BMI Encouraged well-balanced diet Genetic Screening discussed Integrated Screen: requested Cystic fibrosis screening discussed declined Ultrasound discussed; fetal survey: requested Follow up in 4 weeks for 1st it/nt and visit [facility] completed  Marge DuncansBooker, Fanchon Papania Randall CNM, [facility]NP-BC 06/21/2015 2:07 PM

## 2015-06-22 ENCOUNTER — Encounter: Payer: Self-pay | Admitting: Women's Health

## 2015-06-22 DIAGNOSIS — F1290 Cannabis use, unspecified, uncomplicated: Secondary | ICD-10-CM | POA: Insufficient documentation

## 2015-06-23 LAB — HIV ANTIBODY (ROUTINE TESTING W REFLEX): HIV Screen 4th Generation wRfx: NONREACTIVE

## 2015-06-23 LAB — VARICELLA ZOSTER ANTIBODY, IGG: Varicella zoster IgG: 3164 index (ref >165)

## 2015-06-23 LAB — URINALYSIS, ROUTINE W REFLEX MICROSCOPIC
Bilirubin, UA: NEGATIVE
Glucose, UA: NEGATIVE
KETONES UA: NEGATIVE
LEUKOCYTES UA: NEGATIVE
NITRITE UA: NEGATIVE
Protein, UA: NEGATIVE
RBC, UA: NEGATIVE
SPEC GRAV UA: 1.025 (ref 1.005–1.030)
Urobilinogen, Ur: 0.2 mg/dL (ref 0.2–1.0)
pH, UA: 6.5 (ref 5.0–7.5)

## 2015-06-23 LAB — PMP SCREEN PROFILE (10S), URINE
Amphetamine Screen, Ur: NEGATIVE ng/mL
BENZODIAZEPINE SCREEN, URINE: NEGATIVE ng/mL
Barbiturate Screen, Ur: NEGATIVE ng/mL
COCAINE(METAB.) SCREEN, URINE: NEGATIVE ng/mL
CREATININE(CRT), U: 172.8 mg/dL (ref 20.0–300.0)
Cannabinoids Ur Ql Scn: POSITIVE ng/mL
METHADONE SCREEN, URINE: NEGATIVE ng/mL
OPIATE SCRN UR: NEGATIVE ng/mL
OXYCODONE+OXYMORPHONE UR QL SCN: NEGATIVE ng/mL
PCP Scrn, Ur: NEGATIVE ng/mL
PROPOXYPHENE SCREEN: NEGATIVE ng/mL
Ph of Urine: 6.3 (ref 4.5–8.9)

## 2015-06-23 LAB — ANTIBODY SCREEN: Antibody Screen: NEGATIVE

## 2015-06-23 LAB — HEPATITIS B SURFACE ANTIGEN: Hepatitis B Surface Ag: NEGATIVE

## 2015-06-23 LAB — CBC
HEMATOCRIT: 38.8 % (ref 34.0–46.6)
HEMOGLOBIN: 12.3 g/dL (ref 11.1–15.9)
MCH: 23.2 pg — ABNORMAL LOW (ref 26.6–33.0)
MCHC: 31.7 g/dL (ref 31.5–35.7)
MCV: 73 fL — AB (ref 79–97)
Platelets: 304 10*3/uL (ref 150–379)
RBC: 5.31 x10E6/uL — ABNORMAL HIGH (ref 3.77–5.28)
RDW: 15.2 % (ref 12.3–15.4)
WBC: 4.7 10*3/uL (ref 3.4–10.8)

## 2015-06-23 LAB — URINE CULTURE

## 2015-06-23 LAB — SICKLE CELL SCREEN: SICKLE CELL SCREEN: NEGATIVE

## 2015-06-23 LAB — RUBELLA SCREEN: RUBELLA: 19.5 {index} (ref >0.99)

## 2015-06-23 LAB — GC/CHLAMYDIA PROBE AMP
CHLAMYDIA, DNA PROBE: NEGATIVE
NEISSERIA GONORRHOEAE BY PCR: NEGATIVE

## 2015-06-23 LAB — ABO/RH: Rh Factor: POSITIVE

## 2015-06-23 LAB — RPR: RPR: NONREACTIVE

## 2015-07-15 ENCOUNTER — Telehealth: Payer: Self-pay | Admitting: Adult Health

## 2015-07-15 NOTE — Telephone Encounter (Signed)
Left message x 1. JSY 

## 2015-07-15 NOTE — Telephone Encounter (Signed)
Spoke with pt. Pt had vomiting yesterday. Just 1 time. Pt continues to have stomach pain. She is having normal BM's. No spotting or bleeding. I advised to drink plenty of fluids and eat food as tolerated. I advised since vomiting has stopped, BM's are good and no bleeding, everything sounds good at this point. Pt has a scheduled appt on Tuesday. Pt was advised to keep that appt and if she got to feeling worse, call the after hours nurse line or call us Monday. Pt voiced understanding. JSY

## 2015-07-19 ENCOUNTER — Ambulatory Visit (INDEPENDENT_AMBULATORY_CARE_PROVIDER_SITE_OTHER): Payer: 59

## 2015-07-19 ENCOUNTER — Encounter: Payer: Self-pay | Admitting: Women's Health

## 2015-07-19 ENCOUNTER — Ambulatory Visit (INDEPENDENT_AMBULATORY_CARE_PROVIDER_SITE_OTHER): Payer: 59 | Admitting: Women's Health

## 2015-07-19 VITALS — BP 94/60 | HR 72 | Wt 114.0 lb

## 2015-07-19 DIAGNOSIS — O99321 Drug use complicating pregnancy, first trimester: Secondary | ICD-10-CM

## 2015-07-19 DIAGNOSIS — F1290 Cannabis use, unspecified, uncomplicated: Secondary | ICD-10-CM

## 2015-07-19 DIAGNOSIS — Z3481 Encounter for supervision of other normal pregnancy, first trimester: Secondary | ICD-10-CM

## 2015-07-19 DIAGNOSIS — Z331 Pregnant state, incidental: Secondary | ICD-10-CM

## 2015-07-19 DIAGNOSIS — Z36 Encounter for antenatal screening of mother: Secondary | ICD-10-CM

## 2015-07-19 DIAGNOSIS — Z3491 Encounter for supervision of normal pregnancy, unspecified, first trimester: Secondary | ICD-10-CM

## 2015-07-19 DIAGNOSIS — Z1389 Encounter for screening for other disorder: Secondary | ICD-10-CM

## 2015-07-19 DIAGNOSIS — Z3A12 12 weeks gestation of pregnancy: Secondary | ICD-10-CM

## 2015-07-19 DIAGNOSIS — Z3682 Encounter for antenatal screening for nuchal translucency: Secondary | ICD-10-CM

## 2015-07-19 LAB — POCT URINALYSIS DIPSTICK
GLUCOSE UA: NEGATIVE
Ketones, UA: NEGATIVE
Leukocytes, UA: NEGATIVE
NITRITE UA: NEGATIVE
Protein, UA: NEGATIVE
RBC UA: NEGATIVE

## 2015-07-19 NOTE — Progress Notes (Signed)
Low-risk OB appointment G3P0020 1774w5d Estimated Date of Delivery: 02/02/16 BP 94/60 mmHg  Pulse 72  Wt 114 lb (51.71 kg)  LMP 04/28/2015  BP, weight, and urine reviewed.  Refer to obstetrical flow sheet for FH & FHR.  No fm yet. Denies cramping, lof, vb, or uti s/s. No complaints. No THC since ~ 7wks when learned of pregnancy, will plan to recheck at next visit Reviewed today's normal nt u/s, warning s/s to report. Plan:  Continue routine obstetrical care  F/U in 4wks for OB appointment and 2nd IT 1st IT/NT today

## 2015-07-19 NOTE — Patient Instructions (Signed)

## 2015-07-19 NOTE — Progress Notes (Signed)
US 11+5 wks,measurement c/w dates,crl 60.2 mm,NB present,NT 1.2 mm,normal ov's bilat,ant pl gr 0

## 2015-07-25 LAB — MATERNAL SCREEN, INTEGRATED #1
Crown Rump Length: 60.2 mm
GEST. AGE ON COLLECTION DATE: 12.4 wk
Maternal Age at EDD: 25 years
NUCHAL TRANSLUCENCY (NT): 1.2 mm
Number of Fetuses: 1
PAPP-A VALUE: 1842.7 ng/mL
WEIGHT: 114 [lb_av]

## 2015-08-17 ENCOUNTER — Encounter: Payer: Self-pay | Admitting: Advanced Practice Midwife

## 2015-08-17 ENCOUNTER — Ambulatory Visit (INDEPENDENT_AMBULATORY_CARE_PROVIDER_SITE_OTHER): Payer: 59 | Admitting: Advanced Practice Midwife

## 2015-08-17 VITALS — BP 106/72 | HR 80 | Wt 118.0 lb

## 2015-08-17 DIAGNOSIS — Z3492 Encounter for supervision of normal pregnancy, unspecified, second trimester: Secondary | ICD-10-CM

## 2015-08-17 DIAGNOSIS — Z363 Encounter for antenatal screening for malformations: Secondary | ICD-10-CM

## 2015-08-17 DIAGNOSIS — Z3682 Encounter for antenatal screening for nuchal translucency: Secondary | ICD-10-CM

## 2015-08-17 DIAGNOSIS — Z1389 Encounter for screening for other disorder: Secondary | ICD-10-CM

## 2015-08-17 DIAGNOSIS — Z331 Pregnant state, incidental: Secondary | ICD-10-CM

## 2015-08-17 LAB — POCT URINALYSIS DIPSTICK
GLUCOSE UA: NEGATIVE
Ketones, UA: NEGATIVE
Leukocytes, UA: NEGATIVE
Nitrite, UA: NEGATIVE
RBC UA: NEGATIVE

## 2015-08-17 NOTE — Patient Instructions (Signed)

## 2015-08-17 NOTE — Progress Notes (Signed)
[id number] 356w6d Estimated Date of Delivery: 02/02/16  Blood pressure 106/72, pulse 80, weight 53.5 kg (118 lb), last menstrual period 04/28/2015.   BP weight and urine results all reviewed and noted.  Please refer to the obstetrical flow sheet for the fundal height and fetal heart rate documentation:  Patient denies any bleeding and no rupture of membranes symptoms or regular contractions. Patient is without complaints. All questions were answered.  Orders Placed This Encounter  Procedures  . US OB Comp + 14 Wk  . Maternal Screen, Integrated #2  . POCT urinalysis dipstick    Plan:  Continued routine obstetrical care,   Return in about 4 weeks (around 09/14/2015) for LROB, GE:[id number]:Anatomy.

## 2015-08-20 LAB — MATERNAL SCREEN, INTEGRATED #2
ADSF: 1.11
AFP MOM: 1.31
Alpha-Fetoprotein: 56 ng/mL
Crown Rump Length: 60.2 mm
DIA MoM: 1.07
DIA Value: 222 pg/mL
ESTRIOL UNCONJUGATED: 1.08 ng/mL
GEST. AGE ON COLLECTION DATE: 12.4 wk
Gestational Age: 16.6 weeks
HCG MOM: 1.12
Maternal Age at EDD: 25 years
NUCHAL TRANSLUCENCY MOM: 0.78
NUMBER OF FETUSES: 1
Nuchal Translucency (NT): 1.2 mm
PAPP-A MoM: 1.44
PAPP-A Value: 1842.7 ng/mL
PDF: 0
TEST RESULTS: NEGATIVE
Weight: 114 [lb_av]
Weight: 114 [lb_av]
hCG Value: 40.2 IU/mL

## 2015-09-14 ENCOUNTER — Ambulatory Visit (INDEPENDENT_AMBULATORY_CARE_PROVIDER_SITE_OTHER): Payer: 59

## 2015-09-14 ENCOUNTER — Ambulatory Visit (INDEPENDENT_AMBULATORY_CARE_PROVIDER_SITE_OTHER): Payer: 59 | Admitting: Advanced Practice Midwife

## 2015-09-14 ENCOUNTER — Encounter: Payer: Self-pay | Admitting: Advanced Practice Midwife

## 2015-09-14 VITALS — BP 100/60 | HR 72 | Wt 126.0 lb

## 2015-09-14 DIAGNOSIS — Z0283 Encounter for blood-alcohol and blood-drug test: Secondary | ICD-10-CM

## 2015-09-14 DIAGNOSIS — Z36 Encounter for antenatal screening of mother: Secondary | ICD-10-CM

## 2015-09-14 DIAGNOSIS — Z3492 Encounter for supervision of normal pregnancy, unspecified, second trimester: Secondary | ICD-10-CM

## 2015-09-14 DIAGNOSIS — O321XX1 Maternal care for breech presentation, fetus 1: Secondary | ICD-10-CM | POA: Diagnosis not present

## 2015-09-14 DIAGNOSIS — Z3A20 20 weeks gestation of pregnancy: Secondary | ICD-10-CM

## 2015-09-14 DIAGNOSIS — Z363 Encounter for antenatal screening for malformations: Secondary | ICD-10-CM

## 2015-09-14 DIAGNOSIS — Z1389 Encounter for screening for other disorder: Secondary | ICD-10-CM

## 2015-09-14 DIAGNOSIS — Z331 Pregnant state, incidental: Secondary | ICD-10-CM

## 2015-09-14 LAB — POCT URINALYSIS DIPSTICK
Blood, UA: NEGATIVE
Glucose, UA: NEGATIVE
Ketones, UA: NEGATIVE
Leukocytes, UA: NEGATIVE
Nitrite, UA: NEGATIVE

## 2015-09-14 NOTE — Progress Notes (Signed)
[id number] 3774w6d Estimated Date of Delivery: 02/02/16  Blood pressure 100/60, pulse 72, weight 126 lb (57.2 kg), last menstrual period 04/28/2015.   BP weight and urine results all reviewed and noted.  Please refer to the obstetrical flow sheet for the fundal height and fetal heart rate documentation:  US 19+6 wks,breech,cx 3.2 cm,normal ov's bilat,measurements c/w dates,ant pl gr 0,fhr 149 bpm,svp of fluid 3.6 cm,efw 325 g, anatomy complete,no obvious abnormalities seen  Patient reports good fetal movement, denies any bleeding and no rupture of membranes symptoms or regular contractions. Patient is without complaints. All questions were answered.  Orders Placed This Encounter  Procedures  . Pain Management Screening Profile (10S)  . POCT urinalysis dipstick    Plan:  Continued routine obstetrical care,   Return in about 4 weeks (around 10/12/2015) for LROB.

## 2015-09-14 NOTE — Progress Notes (Signed)
US 19+6 wks,breech,cx 3.2 cm,normal ov's bilat,measurements c/w dates,ant pl gr 0,fhr 149 bpm,svp of fluid 3.6 cm,efw 325 g, anatomy complete,no obvious abnormalities seen

## 2015-09-14 NOTE — Patient Instructions (Signed)

## 2015-09-15 LAB — PMP SCREEN PROFILE (10S), URINE
Amphetamine Screen, Ur: NEGATIVE ng/mL
BARBITURATE SCRN UR: NEGATIVE ng/mL
Benzodiazepine Screen, Urine: NEGATIVE ng/mL
CREATININE(CRT), U: 143.8 mg/dL (ref 20.0–300.0)
Cannabinoids Ur Ql Scn: NEGATIVE ng/mL
Cocaine(Metab.)Screen, Urine: NEGATIVE ng/mL
METHADONE SCREEN, URINE: NEGATIVE ng/mL
Opiate Scrn, Ur: NEGATIVE ng/mL
Oxycodone+Oxymorphone Ur Ql Scn: NEGATIVE ng/mL
PCP Scrn, Ur: NEGATIVE ng/mL
PROPOXYPHENE SCREEN: NEGATIVE ng/mL
Ph of Urine: 7.1 (ref 4.5–8.9)

## 2015-10-12 ENCOUNTER — Encounter: Payer: Self-pay | Admitting: Women's Health

## 2015-10-12 ENCOUNTER — Ambulatory Visit (INDEPENDENT_AMBULATORY_CARE_PROVIDER_SITE_OTHER): Payer: 59 | Admitting: Women's Health

## 2015-10-12 VITALS — BP 110/66 | HR 68 | Wt 129.0 lb

## 2015-10-12 DIAGNOSIS — Z331 Pregnant state, incidental: Secondary | ICD-10-CM

## 2015-10-12 DIAGNOSIS — Z3482 Encounter for supervision of other normal pregnancy, second trimester: Secondary | ICD-10-CM

## 2015-10-12 DIAGNOSIS — Z23 Encounter for immunization: Secondary | ICD-10-CM | POA: Diagnosis not present

## 2015-10-12 DIAGNOSIS — Z1389 Encounter for screening for other disorder: Secondary | ICD-10-CM

## 2015-10-12 LAB — POCT URINALYSIS DIPSTICK
Glucose, UA: NEGATIVE
KETONES UA: NEGATIVE
LEUKOCYTES UA: NEGATIVE
Nitrite, UA: NEGATIVE
PROTEIN UA: NEGATIVE
RBC UA: NEGATIVE

## 2015-10-12 NOTE — Progress Notes (Signed)
Low-risk OB appointment G3P0020 4222w6d Estimated Date of Delivery: 02/02/16 BP 110/66   Pulse 68   Wt 129 lb (58.5 kg)   LMP 04/28/2015   BMI 20.20 kg/m   BP, weight, and urine reviewed.  Refer to obstetrical flow sheet for FH & FHR.  Reports good fm.  Denies regular uc's, lof, vb, or uti s/s. No complaints. Reviewed ptl s/s, fm. Plan:  Continue routine obstetrical care  F/U in 4wks for OB appointment and pn2 Flu shot today

## 2015-10-12 NOTE — Patient Instructions (Addendum)
You will have your sugar test next visit.  Please do not eat or drink anything after midnight the night before you come, not even water.  You will be here for at least two hours.     Call the office [phone]) or go to [facility] if:  You begin to have strong, frequent contractions  Your water breaks.  Sometimes it is a big gush of fluid, sometimes it is just a trickle that keeps getting your panties wet or running down your legs  You have vaginal bleeding.  It is normal to have a small amount of spotting if your cervix was checked.   You don't feel your baby moving like normal.  If you don't, get you something to eat and drink and lay down and focus on feeling your baby move.   If your baby is still not moving like normal, you should call the office or go to [facility].  Tips to Help You Sleep Better:   Get into a bedtime routine, try to do the same thing every night before going to bed to try to help your body wind down  Warm baths  Avoid caffeine for at least 3 hours before going to sleep   Keep your room at a slightly cooler temperature, can try running a fan  Turn off TV, lights, phone, electronics  Lots of pillows if needed to help you get comfortable  Lavender scented items can help you sleep. You can place lavender essential oil on a cotton ball and place under your pillowcase, or place in a diffuser. [employer] has a lavender scented sleep line (plug-ins, sprays, etc). Look in the pillow aisle for lavender scented pillows.   If none of the above things help, you can try 1/2 to 1 tablet of benadryl, unisom, or tylenol pm. Do not take this every night, only when you really need it.   [place] Pediatricians/Family Doctors:  [facility] [phone]            [facility] [phone]                 [facility] [phone] (usually not accepting new patients unless you have family there already, you are always welcome to  call and ask)            [facility] ([address] [place]) [phone]   [facility] Pediatricians/Family Doctors:   [facility]: [phone]  [facility]: [phone]     Second Trimester of Pregnancy The second trimester is from week 13 through week 28, months 4 through 6. The second trimester is often a time when you feel your best. Your body has also adjusted to being pregnant, and you begin to feel better physically. Usually, morning sickness has lessened or quit completely, you may have more energy, and you may have an increase in appetite. The second trimester is also a time when the fetus is growing rapidly. At the end of the sixth month, the fetus is about 9 inches long and weighs about 1 pounds. You will likely begin to feel the baby move (quickening) between 18 and 20 weeks of the pregnancy. BODY CHANGES Your body goes through many changes during pregnancy. The changes vary from woman to woman.   Your weight will continue to increase. You will notice your lower abdomen bulging out.  You may begin to get stretch marks on your hips, abdomen, and breasts.  You may develop headaches that can be relieved  by medicines approved by your health care provider.  You may urinate more often because the fetus is pressing on your bladder.  You may develop or continue to have heartburn as a result of your pregnancy.  You may develop constipation because certain hormones are causing the muscles that push waste through your intestines to slow down.  You may develop hemorrhoids or swollen, bulging veins (varicose veins).  You may have back pain because of the weight gain and pregnancy hormones relaxing your joints between the bones in your pelvis and as a result of a shift in weight and the muscles that support your balance.  Your breasts will continue to grow and be tender.  Your gums may bleed and may be sensitive to brushing and  flossing.  Dark spots or blotches (chloasma, mask of pregnancy) may develop on your face. This will likely fade after the baby is born.  A dark line from your belly button to the pubic area (linea nigra) may appear. This will likely fade after the baby is born.  You may have changes in your hair. These can include thickening of your hair, rapid growth, and changes in texture. Some women also have hair loss during or after pregnancy, or hair that feels dry or thin. Your hair will most likely return to normal after your baby is born. WHAT TO EXPECT AT YOUR PRENATAL VISITS During a routine prenatal visit:  You will be weighed to make sure you and the fetus are growing normally.  Your blood pressure will be taken.  Your abdomen will be measured to track your baby's growth.  The fetal heartbeat will be listened to.  Any test results from the previous visit will be discussed. Your health care provider may ask you:  How you are feeling.  If you are feeling the baby move.  If you have had any abnormal symptoms, such as leaking fluid, bleeding, severe headaches, or abdominal cramping.  If you have any questions. Other tests that may be performed during your second trimester include:  Blood tests that check for:  Low iron levels (anemia).  Gestational diabetes (between 24 and 28 weeks).  Rh antibodies.  Urine tests to check for infections, diabetes, or protein in the urine.  An ultrasound to confirm the proper growth and development of the baby.  An amniocentesis to check for possible genetic problems.  Fetal screens for spina bifida and Down syndrome. HOME CARE INSTRUCTIONS   Avoid all smoking, herbs, alcohol, and unprescribed drugs. These chemicals affect the formation and growth of the baby.  Follow your health care provider's instructions regarding medicine use. There are medicines that are either safe or unsafe to take during pregnancy.  Exercise only as directed by your  health care provider. Experiencing uterine cramps is a good sign to stop exercising.  Continue to eat regular, healthy meals.  Wear a good support bra for breast tenderness.  Do not use hot tubs, steam rooms, or saunas.  Wear your seat belt at all times when driving.  Avoid raw meat, uncooked cheese, cat litter boxes, and soil used by cats. These carry germs that can cause birth defects in the baby.  Take your prenatal vitamins.  Try taking a stool softener (if your health care provider approves) if you develop constipation. Eat more high-fiber foods, such as fresh vegetables or fruit and whole grains. Drink plenty of fluids to keep your urine clear or pale yellow.  Take warm sitz baths to soothe any pain or  discomfort caused by hemorrhoids. Use hemorrhoid cream if your health care provider approves.  If you develop varicose veins, wear support hose. Elevate your feet for 15 minutes, 3-4 times a day. Limit salt in your diet.  Avoid heavy lifting, wear low heel shoes, and practice good posture.  Rest with your legs elevated if you have leg cramps or low back pain.  Visit your dentist if you have not gone yet during your pregnancy. Use a soft toothbrush to brush your teeth and be gentle when you floss.  A sexual relationship may be continued unless your health care provider directs you otherwise.  Continue to go to all your prenatal visits as directed by your health care provider. SEEK MEDICAL CARE IF:   You have dizziness.  You have mild pelvic cramps, pelvic pressure, or nagging pain in the abdominal area.  You have persistent nausea, vomiting, or diarrhea.  You have a bad smelling vaginal discharge.  You have pain with urination. SEEK IMMEDIATE MEDICAL CARE IF:   You have a fever.  You are leaking fluid from your vagina.  You have spotting or bleeding from your vagina.  You have severe abdominal cramping or pain.  You have rapid weight gain or loss.  You have  shortness of breath with chest pain.  You notice sudden or extreme swelling of your face, hands, ankles, feet, or legs.  You have not felt your baby move in over an hour.  You have severe headaches that do not go away with medicine.  You have vision changes. Document Released: 12/19/2000 Document Revised: 12/30/2012 Document Reviewed: 02/26/2012 [facility] Patient Information 2015 [place], [place]. This information is not intended to replace advice given to you by your health care provider. Make sure you discuss any questions you have with your health care provider.

## 2015-10-26 ENCOUNTER — Telehealth: Payer: Self-pay | Admitting: Women's Health

## 2015-10-26 NOTE — Telephone Encounter (Signed)
Pt states she is having a lot of allergies, sneezing, nasal congestion, scratchy throat and wanted to know if she can use fexofenadine that she has at home.  Informed her she can take that and can also use saline nasal spray and sleep with humidifier.  Pt reports good FM and no pain/pressure of bleeding.  Pt also informed to push fluids and call if symptoms worsen.  Pt verbalized understanding.

## 2015-11-09 ENCOUNTER — Ambulatory Visit (INDEPENDENT_AMBULATORY_CARE_PROVIDER_SITE_OTHER): Payer: 59 | Admitting: Obstetrics and Gynecology

## 2015-11-09 ENCOUNTER — Other Ambulatory Visit: Payer: 59

## 2015-11-09 VITALS — BP 106/50 | HR 82 | Wt 134.4 lb

## 2015-11-09 DIAGNOSIS — Z131 Encounter for screening for diabetes mellitus: Secondary | ICD-10-CM

## 2015-11-09 DIAGNOSIS — Z3483 Encounter for supervision of other normal pregnancy, third trimester: Secondary | ICD-10-CM

## 2015-11-09 DIAGNOSIS — Z3403 Encounter for supervision of normal first pregnancy, third trimester: Secondary | ICD-10-CM

## 2015-11-09 DIAGNOSIS — Z3A28 28 weeks gestation of pregnancy: Secondary | ICD-10-CM

## 2015-11-09 DIAGNOSIS — Z1389 Encounter for screening for other disorder: Secondary | ICD-10-CM

## 2015-11-09 DIAGNOSIS — Z331 Pregnant state, incidental: Secondary | ICD-10-CM

## 2015-11-09 LAB — POCT URINALYSIS DIPSTICK
Blood, UA: NEGATIVE
GLUCOSE UA: NEGATIVE
Ketones, UA: NEGATIVE
LEUKOCYTES UA: NEGATIVE
NITRITE UA: NEGATIVE
Protein, UA: NEGATIVE

## 2015-11-09 NOTE — Progress Notes (Signed)
[id number]  Estimated Date of Delivery: 02/02/16 LROB 3377w6d here also for 28 wk labs  Blood pressure (!) 106/50, pulse 82, weight 134 lb 6.4 oz (61 kg), last menstrual period 04/28/2015.   Urine results:negative  Chief Complaint  Patient presents with  . Routine Prenatal Visit    PN 2    Patient complaints: none at this time. Patient reports   good fetal movement,                           denies any bleeding, rupture of membranes,or regular contractions.   refer to the ob flow sheet for FH and FHR, ,                          Physical Examination: General appearance - alert, well appearing, and in no distress                                      Abdomen - FH 25 ,                                                         -FHR 122                                                                                                   Pelvic - examination not indicated                                            Questions were answered. Assessment: LROB G3P0020 @ 2777w6d   Plan:  Continued routine obstetrical care,  Strongly urged to attend childbirth classes with partner. Discussed at length  F/u in 4 weeks for routine prenatal care   By signing my name below, I, Sonum Patel, attest that this documentation has been prepared under the direction and in the presence of Tilda BurrowJohn V Joran Kallal, MD. Electronically Signed: Sonum Patel, Neurosurgeoncribe. 11/09/15. 9:57 AM.  I personally performed the services described in this documentation, which was SCRIBED in my presence. The recorded information has been reviewed and considered accurate. It has been edited as necessary during review. Tilda BurrowFERGUSON,Dream Nodal V, MD

## 2015-11-10 LAB — GLUCOSE TOLERANCE, 2 HOURS W/ 1HR
GLUCOSE, FASTING: 69 mg/dL (ref 65–91)
Glucose, 1 hour: 110 mg/dL (ref 65–179)
Glucose, 2 hour: 83 mg/dL (ref 65–152)

## 2015-11-10 LAB — CBC
HEMOGLOBIN: 12.6 g/dL (ref 11.1–15.9)
Hematocrit: 38 % (ref 34.0–46.6)
MCH: 24.6 pg — AB (ref 26.6–33.0)
MCHC: 33.2 g/dL (ref 31.5–35.7)
MCV: 74 fL — AB (ref 79–97)
Platelets: 226 10*3/uL (ref 150–379)
RBC: 5.13 x10E6/uL (ref 3.77–5.28)
RDW: 14.8 % (ref 12.3–15.4)
WBC: 9 10*3/uL (ref 3.4–10.8)

## 2015-11-10 LAB — RPR: RPR Ser Ql: NONREACTIVE

## 2015-11-10 LAB — ANTIBODY SCREEN: ANTIBODY SCREEN: NEGATIVE

## 2015-11-10 LAB — HIV ANTIBODY (ROUTINE TESTING W REFLEX): HIV Screen 4th Generation wRfx: NONREACTIVE

## 2015-12-07 ENCOUNTER — Encounter: Payer: Self-pay | Admitting: Advanced Practice Midwife

## 2015-12-07 ENCOUNTER — Ambulatory Visit (INDEPENDENT_AMBULATORY_CARE_PROVIDER_SITE_OTHER): Payer: 59 | Admitting: Advanced Practice Midwife

## 2015-12-07 VITALS — BP 112/70 | HR 108 | Wt 140.0 lb

## 2015-12-07 DIAGNOSIS — Z3483 Encounter for supervision of other normal pregnancy, third trimester: Secondary | ICD-10-CM

## 2015-12-07 DIAGNOSIS — Z331 Pregnant state, incidental: Secondary | ICD-10-CM

## 2015-12-07 DIAGNOSIS — Z3A32 32 weeks gestation of pregnancy: Secondary | ICD-10-CM

## 2015-12-07 DIAGNOSIS — Z1389 Encounter for screening for other disorder: Secondary | ICD-10-CM

## 2015-12-07 LAB — POCT URINALYSIS DIPSTICK
GLUCOSE UA: NEGATIVE
Ketones, UA: NEGATIVE
Leukocytes, UA: NEGATIVE
NITRITE UA: NEGATIVE
PROTEIN UA: NEGATIVE
RBC UA: NEGATIVE

## 2015-12-07 NOTE — Patient Instructions (Signed)
Third Trimester of Pregnancy The third trimester is from week 29 through week 40 (months 7 through 9). The third trimester is a time when the unborn baby (fetus) is growing rapidly. At the end of the ninth month, the fetus is about 20 inches in length and weighs 6-10 pounds. Body changes during your third trimester Your body goes through many changes during pregnancy. The changes vary from woman to woman. During the third trimester:  Your weight will continue to increase. You can expect to gain 25-35 pounds (11-16 kg) by the end of the pregnancy.  You may begin to get stretch marks on your hips, abdomen, and breasts.  You may urinate more often because the fetus is moving lower into your pelvis and pressing on your bladder.  You may develop or continue to have heartburn. This is caused by increased hormones that slow down muscles in the digestive tract.  You may develop or continue to have constipation because increased hormones slow digestion and cause the muscles that push waste through your intestines to relax.  You may develop hemorrhoids. These are swollen veins (varicose veins) in the rectum that can itch or be painful.  You may develop swollen, bulging veins (varicose veins) in your legs.  You may have increased body aches in the pelvis, back, or thighs. This is due to weight gain and increased hormones that are relaxing your joints.  You may have changes in your hair. These can include thickening of your hair, rapid growth, and changes in texture. Some women also have hair loss during or after pregnancy, or hair that feels dry or thin. Your hair will most likely return to normal after your baby is born.  Your breasts will continue to grow and they will continue to become tender. A yellow fluid (colostrum) may leak from your breasts. This is the first milk you are producing for your baby.  Your belly button may stick out.  You may notice more swelling in your hands, face, or  ankles.  You may have increased tingling or numbness in your hands, arms, and legs. The skin on your belly may also feel numb.  You may feel short of breath because of your expanding uterus.  You may have more problems sleeping. This can be caused by the size of your belly, increased need to urinate, and an increase in your body's metabolism.  You may notice the fetus "dropping," or moving lower in your abdomen.  You may have increased vaginal discharge.  Your cervix becomes thin and soft (effaced) near your due date. What to expect at prenatal visits You will have prenatal exams every 2 weeks until week 36. Then you will have weekly prenatal exams. During a routine prenatal visit:  You will be weighed to make sure you and the fetus are growing normally.  Your blood pressure will be taken.  Your abdomen will be measured to track your baby's growth.  The fetal heartbeat will be listened to.  Any test results from the previous visit will be discussed.  You may have a cervical check near your due date to see if you have effaced. At around 36 weeks, your health care provider will check your cervix. At the same time, your health care provider will also perform a test on the secretions of the vaginal tissue. This test is to determine if a type of bacteria, Group B streptococcus, is present. Your health care provider will explain this further. Your health care provider may ask you:    What your birth plan is.  How you are feeling.  If you are feeling the baby move.  If you have had any abnormal symptoms, such as leaking fluid, bleeding, severe headaches, or abdominal cramping.  If you are using any tobacco products, including cigarettes, chewing tobacco, and electronic cigarettes.  If you have any questions. Other tests or screenings that may be performed during your third trimester include:  Blood tests that check for low iron levels (anemia).  Fetal testing to check the health,  activity level, and growth of the fetus. Testing is done if you have certain medical conditions or if there are problems during the pregnancy.  Nonstress test (NST). This test checks the health of your baby to make sure there are no signs of problems, such as the baby not getting enough oxygen. During this test, a belt is placed around your belly. The baby is made to move, and its heart rate is monitored during movement. What is false labor? False labor is a condition in which you feel small, irregular tightenings of the muscles in the womb (contractions) that eventually go away. These are called Braxton Hicks contractions. Contractions may last for hours, days, or even weeks before true labor sets in. If contractions come at regular intervals, become more frequent, increase in intensity, or become painful, you should see your health care provider. What are the signs of labor?  Abdominal cramps.  Regular contractions that start at 10 minutes apart and become stronger and more frequent with time.  Contractions that start on the top of the uterus and spread down to the lower abdomen and back.  Increased pelvic pressure and dull back pain.  A watery or bloody mucus discharge that comes from the vagina.  Leaking of amniotic fluid. This is also known as your "water breaking." It could be a slow trickle or a gush. Let your doctor know if it has a color or strange odor. If you have any of these signs, call your health care provider right away, even if it is before your due date. Follow these instructions at home: Eating and drinking  Continue to eat regular, healthy meals.  Do not eat:  Raw meat or meat spreads.  Unpasteurized milk or cheese.  Unpasteurized juice.  Store-made salad.  Refrigerated smoked seafood.  Hot dogs or deli meat, unless they are piping hot.  More than 6 ounces of albacore tuna a week.  Shark, swordfish, king mackerel, or tile fish.  Store-made salads.  Raw  sprouts, such as mung bean or alfalfa sprouts.  Take prenatal vitamins as told by your health care provider.  Take 1000 mg of calcium daily as told by your health care provider.  If you develop constipation:  Take over-the-counter or prescription medicines.  Drink enough fluid to keep your urine clear or pale yellow.  Eat foods that are high in fiber, such as fresh fruits and vegetables, whole grains, and beans.  Limit foods that are high in fat and processed sugars, such as fried and sweet foods. Activity  Exercise only as directed by your health care provider. Healthy pregnant women should aim for 2 hours and 30 minutes of moderate exercise per week. If you experience any pain or discomfort while exercising, stop.  Avoid heavy lifting.  Do not exercise in extreme heat or humidity, or at high altitudes.  Wear low-heel, comfortable shoes.  Practice good posture.  Do not travel far distances unless it is absolutely necessary and only with the approval   of your health care provider.  Wear your seat belt at all times while in a car, on a bus, or on a plane.  Take frequent breaks and rest with your legs elevated if you have leg cramps or low back pain.  Do not use hot tubs, steam rooms, or saunas.  You may continue to have sex unless your health care provider tells you otherwise. Lifestyle  Do not use any products that contain nicotine or tobacco, such as cigarettes and e-cigarettes. If you need help quitting, ask your health care provider.  Do not drink alcohol.  Do not use any medicinal herbs or unprescribed drugs. These chemicals affect the formation and growth of the baby.  If you develop varicose veins:  Wear support pantyhose or compression stockings as told by your healthcare provider.  Elevate your feet for 15 minutes, 3-4 times a day.  Wear a supportive maternity bra to help with breast tenderness. General instructions  Take over-the-counter and prescription  medicines only as told by your health care provider. There are medicines that are either safe or unsafe to take during pregnancy.  Take warm sitz baths to soothe any pain or discomfort caused by hemorrhoids. Use hemorrhoid cream or witch hazel if your health care provider approves.  Avoid cat litter boxes and soil used by cats. These carry germs that can cause birth defects in the baby. If you have a cat, ask someone to clean the litter box for you.  To prepare for the arrival of your baby:  Take prenatal classes to understand, practice, and ask questions about the labor and delivery.  Make a trial run to the hospital.  Visit the hospital and tour the maternity area.  Arrange for maternity or paternity leave through employers.  Arrange for family and friends to take care of pets while you are in the hospital.  Purchase a rear-facing car seat and make sure you know how to install it in your car.  Pack your hospital bag.  Prepare the baby's nursery. Make sure to remove all pillows and stuffed animals from the baby's crib to prevent suffocation.  Visit your dentist if you have not gone during your pregnancy. Use a soft toothbrush to brush your teeth and be gentle when you floss.  Keep all prenatal follow-up visits as told by your health care provider. This is important. Contact a health care provider if:  You are unsure if you are in labor or if your water has broken.  You become dizzy.  You have mild pelvic cramps, pelvic pressure, or nagging pain in your abdominal area.  You have lower back pain.  You have persistent nausea, vomiting, or diarrhea.  You have an unusual or bad smelling vaginal discharge.  You have pain when you urinate. Get help right away if:  You have a fever.  You are leaking fluid from your vagina.  You have spotting or bleeding from your vagina.  You have severe abdominal pain or cramping.  You have rapid weight loss or weight gain.  You have  shortness of breath with chest pain.  You notice sudden or extreme swelling of your face, hands, ankles, feet, or legs.  Your baby makes fewer than 10 movements in 2 hours.  You have severe headaches that do not go away with medicine.  You have vision changes. Summary  The third trimester is from week 29 through week 40, months 7 through 9. The third trimester is a time when the unborn baby (fetus)   is growing rapidly.  During the third trimester, your discomfort may increase as you and your baby continue to gain weight. You may have abdominal, leg, and back pain, sleeping problems, and an increased need to urinate.  During the third trimester your breasts will keep growing and they will continue to become tender. A yellow fluid (colostrum) may leak from your breasts. This is the first milk you are producing for your baby.  False labor is a condition in which you feel small, irregular tightenings of the muscles in the womb (contractions) that eventually go away. These are called Braxton Hicks contractions. Contractions may last for hours, days, or even weeks before true labor sets in.  Signs of labor can include: abdominal cramps; regular contractions that start at 10 minutes apart and become stronger and more frequent with time; watery or bloody mucus discharge that comes from the vagina; increased pelvic pressure and dull back pain; and leaking of amniotic fluid. This information is not intended to replace advice given to you by your health care provider. Make sure you discuss any questions you have with your health care provider. Document Released: 12/19/2000 Document Revised: 06/02/2015 Document Reviewed: 02/26/2012 [employer] Interactive Patient Education  2017 [employer]  

## 2015-12-07 NOTE — Progress Notes (Signed)
[id number] Estimated Date of Delivery: 02/02/16  Blood pressure 112/70, pulse (!) 108, weight 140 lb (63.5 kg), last menstrual period 04/28/2015.   BP weight and urine results all reviewed and noted.  Please refer to the obstetrical flow sheet for the fundal height and fetal heart rate documentation:  Patient reports good fetal movement, denies any bleeding and no rupture of membranes symptoms or regular contractions. Patient is without complaints. All questions were answered.  Orders Placed This Encounter  Procedures  . POCT urinalysis dipstick    Plan:  Continued routine obstetrical care,   Return in about 2 weeks (around 12/21/2015) for LROB.

## 2015-12-21 ENCOUNTER — Ambulatory Visit (INDEPENDENT_AMBULATORY_CARE_PROVIDER_SITE_OTHER): Payer: 59 | Admitting: Advanced Practice Midwife

## 2015-12-21 VITALS — BP 110/80 | HR 84 | Wt 141.0 lb

## 2015-12-21 DIAGNOSIS — Z331 Pregnant state, incidental: Secondary | ICD-10-CM

## 2015-12-21 DIAGNOSIS — F1290 Cannabis use, unspecified, uncomplicated: Secondary | ICD-10-CM

## 2015-12-21 DIAGNOSIS — O99323 Drug use complicating pregnancy, third trimester: Secondary | ICD-10-CM

## 2015-12-21 DIAGNOSIS — O99513 Diseases of the respiratory system complicating pregnancy, third trimester: Secondary | ICD-10-CM | POA: Diagnosis not present

## 2015-12-21 DIAGNOSIS — Z3483 Encounter for supervision of other normal pregnancy, third trimester: Secondary | ICD-10-CM

## 2015-12-21 DIAGNOSIS — Z3A34 34 weeks gestation of pregnancy: Secondary | ICD-10-CM

## 2015-12-21 DIAGNOSIS — Z1389 Encounter for screening for other disorder: Secondary | ICD-10-CM

## 2015-12-21 LAB — POCT URINALYSIS DIPSTICK
Blood, UA: NEGATIVE
GLUCOSE UA: NEGATIVE
LEUKOCYTES UA: NEGATIVE
Nitrite, UA: NEGATIVE
Protein, UA: NEGATIVE

## 2015-12-21 NOTE — Patient Instructions (Addendum)
AM I IN LABOR? What is labor? Labor is the work that your body does to birth your baby. Your uterus (the womb) contracts. Your cervix (the mouth of the uterus) opens. You will push your baby out into the world.  What do contractions (labor pains) feel like? When they first start, contractions usually feel like cramps during your period. Sometimes you feel pain in your back. Most often, contractions feel like muscles pulling painfully in your lower belly. At first, the contractions will probably be 15 to 20 minutes apart. They will not feel too painful. As labor goes on, the contractions get stronger, closer together, and more painful.  How do I time the contractions? Time your contractions by counting the number of minutes from the start of one contraction to the start of the next contraction.  What should I do when the contractions start? If it is night and you can sleep, sleep. If it happens during the day, here are some things you can do to take care of yourself at home: ? Walk. If the pains you are having are real labor, walking will make the contractions come faster and harder. If the contractions are not going to continue and be real labor, walking will make the contractions slow down. ? Take a shower or bath. This will help you relax. ? Eat. Labor is a big event. It takes a lot of energy. ? Drink water. Not drinking enough water can cause false labor (contractions that hurt but do not open your cervix). If this is true labor, drinking water will help you have strength to get through your labor. ? Take a nap. Get all the rest you can. ? Get a massage. If your labor is in your back, a strong massage on your lower back may feel very good. Getting a foot massage is always good. ? Don't panic. You can do this. Your body was made for this. You are strong!  When should I go to the hospital or call my health care provider? ? Your contractions have been 5 minutes apart or less for at least 1  hour. ? If several contractions are so painful you cannot walk or talk during one. ? Your bag of waters breaks. (You may have a big gush of water or just water that runs down your legs when you walk.)  Are there other reasons to call my health care provider? Yes, you should call your health care provider or go to the hospital if you start to bleed like you are having a period- blood that soaks your underwear or runs down your legs, if you have sudden severe pain, if your baby has not moved for several hours, or if you are leaking green fluid. The rule is as follows: If you are very concerned about something, call.   Safe Medications in Pregnancy   Acne: Benzoyl Peroxide Salicylic Acid  Backache/Headache: Tylenol: 2 regular strength every 4 hours OR              2 Extra strength every 6 hours  Colds/Coughs/Allergies: Benadryl (alcohol free) 25 mg every 6 hours as needed Breath right strips Claritin Cepacol throat lozenges Chloraseptic throat spray Cold-Eeze- up to three times per day Cough drops, alcohol free Flonase (by prescription only) Guaifenesin Mucinex Robitussin DM (plain only, alcohol free) Saline nasal spray/drops Sudafed (pseudoephedrine) & Actifed ** use only after [redacted] weeks gestation and if you do not have high blood pressure Tylenol Vicks Vaporub Zinc lozenges Zyrtec     Constipation: Colace Ducolax suppositories Fleet enema Glycerin suppositories Metamucil Milk of magnesia Miralax Senokot Smooth move tea  Diarrhea: Kaopectate Imodium A-D  *NO pepto Bismol  Hemorrhoids: Anusol Anusol HC Preparation H Tucks  Indigestion: Tums Maalox Mylanta Zantac  Pepcid  Insomnia: Benadryl (alcohol free) 25mg every 6 hours as needed Tylenol PM Unisom, no Gelcaps  Leg Cramps: Tums MagGel  Nausea/Vomiting:  Bonine Dramamine Emetrol Ginger extract Sea bands Meclizine  Nausea medication to take during pregnancy:  Unisom (doxylamine succinate  25 mg tablets) Take one tablet daily at bedtime. If symptoms are not adequately controlled, the dose can be increased to a maximum recommended dose of two tablets daily (1/2 tablet in the morning, 1/2 tablet mid-afternoon and one at bedtime). Vitamin B6 100mg tablets. Take one tablet twice a day (up to 200 mg per day).  Skin Rashes: Aveeno products Benadryl cream or 25mg every 6 hours as needed Calamine Lotion 1% cortisone cream  Yeast infection: Gyne-lotrimin 7 Monistat 7   **If taking multiple medications, please check labels to avoid duplicating the same active ingredients **take medication as directed on the label ** Do not exceed 4000 mg of tylenol in 24 hours **Do not take medications that contain aspirin or ibuprofen     

## 2015-12-21 NOTE — Progress Notes (Signed)
[id number] Estimated Date of Delivery: 02/02/16  Last menstrual period 04/28/2015.   BP weight and urine results all reviewed and noted.  Please refer to the obstetrical flow sheet for the fundal height and fetal heart rate documentation:  Patient reports good fetal movement, denies any bleeding States she had "water come out" once a day for the past 2 days, none today.  SSE:  No pooling, vaginal without abnormal discharge.  No evidence of ROM. Patient has URI sx, sore throat for 3 days.   All questions were answered.  Orders Placed This Encounter  Procedures  . Pain Management Screening Profile (10S)  . POCT urinalysis dipstick    Plan:  Continued routine obstetrical care, call next week if still sick for antibiotics  Return in about 2 weeks (around 01/04/2016) for LROB.

## 2015-12-22 LAB — PMP SCREEN PROFILE (10S), URINE
AMPHETAMINE SCRN UR: NEGATIVE ng/mL
Barbiturate Screen, Ur: NEGATIVE ng/mL
Benzodiazepine Screen, Urine: NEGATIVE ng/mL
CANNABINOIDS UR QL SCN: NEGATIVE ng/mL
COCAINE(METAB.) SCREEN, URINE: NEGATIVE ng/mL
Creatinine(Crt), U: 67.7 mg/dL (ref 20.0–300.0)
METHADONE SCREEN, URINE: NEGATIVE ng/mL
OXYCODONE+OXYMORPHONE UR QL SCN: NEGATIVE ng/mL
Opiate Scrn, Ur: NEGATIVE ng/mL
PCP SCRN UR: NEGATIVE ng/mL
PROPOXYPHENE SCREEN: NEGATIVE ng/mL
Ph of Urine: 5.8 (ref 4.5–8.9)

## 2016-01-04 ENCOUNTER — Encounter: Payer: 59 | Admitting: Women's Health

## 2016-02-15 ENCOUNTER — Ambulatory Visit: Payer: 59 | Admitting: Women's Health

## 2016-02-23 ENCOUNTER — Encounter: Payer: Self-pay | Admitting: Women's Health

## 2016-02-23 ENCOUNTER — Ambulatory Visit (INDEPENDENT_AMBULATORY_CARE_PROVIDER_SITE_OTHER): Payer: 59 | Admitting: Women's Health

## 2016-02-23 DIAGNOSIS — Z8751 Personal history of pre-term labor: Secondary | ICD-10-CM

## 2016-02-23 NOTE — Patient Instructions (Signed)
NO SEX UNTIL AFTER YOU GET YOUR BIRTH CONTROL   Levonorgestrel intrauterine device (IUD) What is this medicine? LEVONORGESTREL IUD (LEE voe nor jes trel) is a contraceptive (birth control) device. The device is placed inside the uterus by a healthcare professional. It is used to prevent pregnancy. This device can also be used to treat heavy bleeding that occurs during your period. This medicine may be used for other purposes; ask your health care provider or pharmacist if you have questions. COMMON BRAND NAME(S): Cameron Ali What should I tell my health care provider before I take this medicine? They need to know if you have any of these conditions: -abnormal Pap smear -cancer of the breast, uterus, or cervix -diabetes -endometritis -genital or pelvic infection now or in the past -have more than one sexual partner or your partner has more than one partner -heart disease -history of an ectopic or tubal pregnancy -immune system problems -IUD in place -liver disease or tumor -problems with blood clots or take blood-thinners -seizures -use intravenous drugs -uterus of unusual shape -vaginal bleeding that has not been explained -an unusual or allergic reaction to levonorgestrel, other hormones, silicone, or polyethylene, medicines, foods, dyes, or preservatives -pregnant or trying to get pregnant -breast-feeding How should I use this medicine? This device is placed inside the uterus by a health care professional. Talk to your pediatrician regarding the use of this medicine in children. Special care may be needed. Overdosage: If you think you have taken too much of this medicine contact a poison control center or emergency room at once. NOTE: This medicine is only for you. Do not share this medicine with others. What if I miss a dose? This does not apply. Depending on the brand of device you have inserted, the device will need to be replaced every 3 to 5 years if you  wish to continue using this type of birth control. What may interact with this medicine? Do not take this medicine with any of the following medications: -amprenavir -bosentan -fosamprenavir This medicine may also interact with the following medications: -aprepitant -barbiturate medicines for inducing sleep or treating seizures -bexarotene -griseofulvin -medicines to treat seizures like carbamazepine, ethotoin, felbamate, oxcarbazepine, phenytoin, topiramate -modafinil -pioglitazone -rifabutin -rifampin -rifapentine -some medicines to treat HIV infection like atazanavir, indinavir, lopinavir, nelfinavir, tipranavir, ritonavir -St. John's wort -warfarin This list may not describe all possible interactions. Give your health care provider a list of all the medicines, herbs, non-prescription drugs, or dietary supplements you use. Also tell them if you smoke, drink alcohol, or use illegal drugs. Some items may interact with your medicine. What should I watch for while using this medicine? Visit your doctor or health care professional for regular check ups. See your doctor if you or your partner has sexual contact with others, becomes HIV positive, or gets a sexual transmitted disease. This product does not protect you against HIV infection (AIDS) or other sexually transmitted diseases. You can check the placement of the IUD yourself by reaching up to the top of your vagina with clean fingers to feel the threads. Do not pull on the threads. It is a good habit to check placement after each menstrual period. Call your doctor right away if you feel more of the IUD than just the threads or if you cannot feel the threads at all. The IUD may come out by itself. You may become pregnant if the device comes out. If you notice that the IUD has come out use a backup  birth control method like condoms and call your health care provider. Using tampons will not change the position of the IUD and are okay to use  during your period. This IUD can be safely scanned with magnetic resonance imaging (MRI) only under specific conditions. Before you have an MRI, tell your healthcare provider that you have an IUD in place, and which type of IUD you have in place. What side effects may I notice from receiving this medicine? Side effects that you should report to your doctor or health care professional as soon as possible: -allergic reactions like skin rash, itching or hives, swelling of the face, lips, or tongue -fever, flu-like symptoms -genital sores -high blood pressure -no menstrual period for 6 weeks during use -pain, swelling, warmth in the leg -pelvic pain or tenderness -severe or sudden headache -signs of pregnancy -stomach cramping -sudden shortness of breath -trouble with balance, talking, or walking -unusual vaginal bleeding, discharge -yellowing of the eyes or skin Side effects that usually do not require medical attention (report to your doctor or health care professional if they continue or are bothersome): -acne -breast pain -change in sex drive or performance -changes in weight -cramping, dizziness, or faintness while the device is being inserted -headache -irregular menstrual bleeding within first 3 to 6 months of use -nausea This list may not describe all possible side effects. Call your doctor for medical advice about side effects. You may report side effects to FDA at 1-800-FDA-1088. Where should I keep my medicine? This does not apply. NOTE: This sheet is a summary. It may not cover all possible information. If you have questions about this medicine, talk to your doctor, pharmacist, or health care provider.  2017 Elsevier/Gold Standard (2015-06-17 13:46:37)

## 2016-02-23 NOTE — Progress Notes (Signed)
Subjective:    Cheryl Hebert is a 26 y.o. [phone] African American female who presents for a postpartum visit. She is 7 weeks postpartum following a spontaneous [facility]. States on Christmas Eve had a fever of 102 and chills, started spotting on Christmas, went to [place], was having contractions, at first wasn't dilating, then all of a sudden was 4cm then 10cm. Anesthesia: none. I have fully reviewed the prenatal and intrapartum course. Postpartum course has been uncomplicated. Baby's course has been uncomplicated. Baby is feeding by bottle. Bleeding states she had some spotting yesterday. Bowel function is normal. Bladder function is normal. Patient is sexually active. Last sexual activity: 2/9. Contraception method is none and wants IUD. Postpartum depression screening: negative. Score 4.  Last pap 02/28/15 and was neg.  The following portions of the patient's history were reviewed and updated as appropriate: allergies, current medications, past medical history, past surgical history and problem list.  Review of Systems Pertinent items are noted in HPI.   Vitals:   02/23/16 1408  BP: (!) 104/58  Pulse: 76  Weight: 123 lb (55.8 kg)  Height: 5\' 7"  (1.702 m)   Patient's last menstrual period was 04/28/2015.  Objective:   General:  alert, cooperative and no distress   Breasts:  deferred, no complaints  Lungs: clear to auscultation bilaterally  Heart:  regular rate and rhythm  Abdomen: soft, nontender   Vulva: normal  Vagina: normal vagina  Cervix:  closed  Corpus: Well-involuted  Adnexa:  Non-palpable  Rectal Exam: No hemorrhoids        Assessment:   Postpartum exam 7 wks s/p SVB @ 35wks Bottlefeeding Depression screening Contraception counseling   Plan:  Contraception: abstinence until IUD insertion Follow up in: Mon am for bhcg, pm for IUD insertion [facility] Marge DuncansBooker, Kelce Bouton Randall CNM,  [facility] 02/23/2016 2:10 PM

## 2016-02-27 ENCOUNTER — Encounter: Payer: Self-pay | Admitting: Women's Health

## 2016-03-02 ENCOUNTER — Encounter: Payer: Self-pay | Admitting: Women's Health

## 2016-03-02 ENCOUNTER — Ambulatory Visit (INDEPENDENT_AMBULATORY_CARE_PROVIDER_SITE_OTHER): Payer: 59 | Admitting: Women's Health

## 2016-03-02 VITALS — BP 100/78 | HR 64 | Ht 67.0 in | Wt 123.5 lb

## 2016-03-02 DIAGNOSIS — Z3202 Encounter for pregnancy test, result negative: Secondary | ICD-10-CM | POA: Diagnosis not present

## 2016-03-02 DIAGNOSIS — Z30430 Encounter for insertion of intrauterine contraceptive device: Secondary | ICD-10-CM | POA: Diagnosis not present

## 2016-03-02 LAB — POCT URINE PREGNANCY: PREG TEST UR: NEGATIVE

## 2016-03-02 NOTE — Progress Notes (Signed)
Cheryl Hebert is a 26 y.o. year old [id number] African American female who presents for placement of a Mirena IUD.  No LMP recorded. BP 100/78 (BP Location: Left Arm, Patient Position: Sitting, Cuff Size: Normal)   Pulse 64   Ht 5\' 7"  (1.702 m)   Wt 123 lb 8 oz (56 kg)   Breastfeeding? No   BMI 19.34 kg/m  Last sexual intercourse was 2/9, and pregnancy test today was neg  The risks and benefits of the method and placement have been thouroughly reviewed with the patient and all questions were answered.  Specifically the patient is aware of failure rate of 01/998, expulsion of the IUD and of possible perforation.  The patient is aware of irregular bleeding due to the method and understands the incidence of irregular bleeding diminishes with time.  Signed copy of informed consent in chart.   Time out was performed.  A graves speculum was placed in the vagina.  The cervix was visualized, prepped using Betadine, and grasped with a single tooth tenaculum. The uterus was found to be anteroflexed and it sounded to 9 cm.  Mirena IUD placed per manufacturer's recommendations.   The strings were trimmed to 3 cm.  Sonogram was performed and the proper placement of the IUD was verified via transvaginal u/s.   The patient was given post procedure instructions, including signs and symptoms of infection and to check for the strings after each menses or each month, and refraining from intercourse or anything in the vagina for 3 days.  She was given a Mirena care card with date IUD placed, and date IUD to be removed.  She is scheduled for a f/u appointment in 4 weeks.  Marge DuncansBooker, Cheryl Hebert CNM, [facility] 03/02/2016 1:07 PM

## 2016-03-02 NOTE — Patient Instructions (Signed)
 Nothing in vagina for 3 days (no sex, douching, tampons, etc...)  Check your strings once a month to make sure you can feel them, if you are not able to please let us know  If you develop a fever of 100.4 or more in the next few weeks, or if you develop severe abdominal pain, please let us know  Use a backup method of birth control, such as condoms, for 2 weeks   Levonorgestrel intrauterine device (IUD) What is this medicine? LEVONORGESTREL IUD (LEE voe nor jes trel) is a contraceptive (birth control) device. The device is placed inside the uterus by a healthcare professional. It is used to prevent pregnancy. This device can also be used to treat heavy bleeding that occurs during your period. This medicine may be used for other purposes; ask your health care provider or pharmacist if you have questions. COMMON BRAND NAME(S): Kyleena, LILETTA, Mirena, Skyla What should I tell my health care provider before I take this medicine? They need to know if you have any of these conditions: -abnormal Pap smear -cancer of the breast, uterus, or cervix -diabetes -endometritis -genital or pelvic infection now or in the past -have more than one sexual partner or your partner has more than one partner -heart disease -history of an ectopic or tubal pregnancy -immune system problems -IUD in place -liver disease or tumor -problems with blood clots or take blood-thinners -seizures -use intravenous drugs -uterus of unusual shape -vaginal bleeding that has not been explained -an unusual or allergic reaction to levonorgestrel, other hormones, silicone, or polyethylene, medicines, foods, dyes, or preservatives -pregnant or trying to get pregnant -breast-feeding How should I use this medicine? This device is placed inside the uterus by a health care professional. Talk to your pediatrician regarding the use of this medicine in children. Special care may be needed. Overdosage: If you think you have  taken too much of this medicine contact a poison control center or emergency room at once. NOTE: This medicine is only for you. Do not share this medicine with others. What if I miss a dose? This does not apply. Depending on the brand of device you have inserted, the device will need to be replaced every 3 to 5 years if you wish to continue using this type of birth control. What may interact with this medicine? Do not take this medicine with any of the following medications: -amprenavir -bosentan -fosamprenavir This medicine may also interact with the following medications: -aprepitant -barbiturate medicines for inducing sleep or treating seizures -bexarotene -griseofulvin -medicines to treat seizures like carbamazepine, ethotoin, felbamate, oxcarbazepine, phenytoin, topiramate -modafinil -pioglitazone -rifabutin -rifampin -rifapentine -some medicines to treat HIV infection like atazanavir, indinavir, lopinavir, nelfinavir, tipranavir, ritonavir -St. John's wort -warfarin This list may not describe all possible interactions. Give your health care provider a list of all the medicines, herbs, non-prescription drugs, or dietary supplements you use. Also tell them if you smoke, drink alcohol, or use illegal drugs. Some items may interact with your medicine. What should I watch for while using this medicine? Visit your doctor or health care professional for regular check ups. See your doctor if you or your partner has sexual contact with others, becomes HIV positive, or gets a sexual transmitted disease. This product does not protect you against HIV infection (AIDS) or other sexually transmitted diseases. You can check the placement of the IUD yourself by reaching up to the top of your vagina with clean fingers to feel the threads. Do not pull on   the threads. It is a good habit to check placement after each menstrual period. Call your doctor right away if you feel more of the IUD than just the  threads or if you cannot feel the threads at all. The IUD may come out by itself. You may become pregnant if the device comes out. If you notice that the IUD has come out use a backup birth control method like condoms and call your health care provider. Using tampons will not change the position of the IUD and are okay to use during your period. This IUD can be safely scanned with magnetic resonance imaging (MRI) only under specific conditions. Before you have an MRI, tell your healthcare provider that you have an IUD in place, and which type of IUD you have in place. What side effects may I notice from receiving this medicine? Side effects that you should report to your doctor or health care professional as soon as possible: -allergic reactions like skin rash, itching or hives, swelling of the face, lips, or tongue -fever, flu-like symptoms -genital sores -high blood pressure -no menstrual period for 6 weeks during use -pain, swelling, warmth in the leg -pelvic pain or tenderness -severe or sudden headache -signs of pregnancy -stomach cramping -sudden shortness of breath -trouble with balance, talking, or walking -unusual vaginal bleeding, discharge -yellowing of the eyes or skin Side effects that usually do not require medical attention (report to your doctor or health care professional if they continue or are bothersome): -acne -breast pain -change in sex drive or performance -changes in weight -cramping, dizziness, or faintness while the device is being inserted -headache -irregular menstrual bleeding within first 3 to 6 months of use -nausea This list may not describe all possible side effects. Call your doctor for medical advice about side effects. You may report side effects to FDA at 1-800-FDA-1088. Where should I keep my medicine? This does not apply. NOTE: This sheet is a summary. It may not cover all possible information. If you have questions about this medicine, talk to  your doctor, pharmacist, or health care provider.  2017 Elsevier/Gold Standard (2015-06-17 13:46:37)  

## 2016-03-30 ENCOUNTER — Ambulatory Visit: Payer: 59 | Admitting: Women's Health

## 2016-04-23 ENCOUNTER — Ambulatory Visit: Payer: 59 | Admitting: Women's Health

## 2016-05-03 ENCOUNTER — Encounter: Payer: Self-pay | Admitting: Advanced Practice Midwife

## 2016-05-03 ENCOUNTER — Telehealth: Payer: Self-pay | Admitting: Women's Health

## 2016-05-03 ENCOUNTER — Other Ambulatory Visit: Payer: Self-pay | Admitting: Advanced Practice Midwife

## 2016-05-03 MED ORDER — METRONIDAZOLE 500 MG PO TABS: 500.0000 mg | ORAL_TABLET | Freq: Two times a day (BID) | ORAL | 1 refills | Status: DC

## 2016-05-03 NOTE — Progress Notes (Signed)
Flagyl for BV sx °

## 2016-05-03 NOTE — Telephone Encounter (Signed)
Pt called stating that she would like for the Doctor to call her in a prescription without being seen. Pt states that she know what she has and just needs the medication. Please contact pt

## 2016-05-03 NOTE — Telephone Encounter (Signed)
Patient called stating she has had BV in the past and is having the same symptoms as before. She would like a prescription to treat without being seen. I informed her that she may likely need an appointment to verify but patient insisted I ask first. Please advise.

## 2016-05-07 ENCOUNTER — Telehealth: Payer: Self-pay | Admitting: Advanced Practice Midwife

## 2016-05-07 NOTE — Telephone Encounter (Signed)
Pt called on 4/26 to see if she could get a medication for BV without being see. Please contact pt

## 2016-05-07 NOTE — Telephone Encounter (Signed)
LMOVm that prescription for Flagyl was sent by Drenda Freeze on 4/26 but needed to make appt if med did not help.

## 2016-06-19 ENCOUNTER — Ambulatory Visit (INDEPENDENT_AMBULATORY_CARE_PROVIDER_SITE_OTHER): Payer: 59 | Admitting: Women's Health

## 2016-06-19 ENCOUNTER — Encounter: Payer: Self-pay | Admitting: Women's Health

## 2016-06-19 VITALS — BP 90/50 | HR 78 | Wt 114.4 lb

## 2016-06-19 DIAGNOSIS — N898 Other specified noninflammatory disorders of vagina: Secondary | ICD-10-CM | POA: Diagnosis not present

## 2016-06-19 LAB — POCT WET PREP (WET MOUNT)
Clue Cells Wet Prep Whiff POC: NEGATIVE
Trichomonas Wet Prep HPF POC: ABSENT

## 2016-06-19 NOTE — Progress Notes (Signed)
   [facility] Visit  Patient name: Cheryl Hebert MRN [id number]  Date of birth: 09/05/1990  CC & HPI:  Cheryl Hebert is a 26 y.o. [id number] African American female presenting today for report of vaginal d/c, thought she had BV in April, so had metronidazole called in 4/26. States d/c returned and still had a few of her pills left, so took those, feels better, just wants to be 'checked out'. Denies odor/itching/irritation. Has had some intermittent headaches and nausea.  No LMP recorded. The current method of family planning is IUD. Last pap 02/28/15, neg  Pertinent History Reviewed:  Medical & Surgical Hx:   Past medical, surgical, family, and social history reviewed in electronic medical record Medications: Reviewed & Updated - see associated section Allergies: Reviewed in electronic medical record  Objective Findings:  Vitals: BP (!) 90/50   Pulse 78   Wt 114 lb 6.4 oz (51.9 kg)   BMI 17.92 kg/m  Body mass index is 17.92 kg/m.  Physical Examination: General appearance - alert, well appearing, and in no distress Pelvic - IUD strings visible, small amt light brown nonodorous d/c  Results for orders placed or performed in visit on 06/19/16 (from the past 24 hour(s))  POCT Wet Prep Mellody Drown(Wet [place])   Collection Time: 06/19/16 12:12 PM  Result Value Ref Range   Source Wet Prep POC vaginal    WBC, Wet Prep HPF POC few    Bacteria Wet Prep HPF POC None (A) Few   BACTERIA WET PREP MORPHOLOGY POC     Clue Cells Wet Prep HPF POC None None   Clue Cells Wet Prep Whiff POC Negative Whiff    Yeast Wet Prep HPF POC Few    KOH Wet Prep POC     Trichomonas Wet Prep HPF POC Absent Absent     Assessment & Plan:  A:   Mild vaginal yeast, asymptomatic   P:  Discussed we could treat mild yeast, or since asymptomatic and just a few on slide, can see how it goes w/o tx- let us know if decides for rx or can use otc yeast cream  GC/CT sent  Return for Feb for physical.  Marge DuncansBooker, Brandi Armato  Randall CNM, [place] [facility] 06/19/2016 12:12 PM

## 2016-06-21 ENCOUNTER — Encounter: Payer: Self-pay | Admitting: Women's Health

## 2016-06-21 DIAGNOSIS — A64 Unspecified sexually transmitted disease: Secondary | ICD-10-CM | POA: Insufficient documentation

## 2016-06-21 LAB — GC/CHLAMYDIA PROBE AMP
Chlamydia trachomatis, NAA: POSITIVE — AB
NEISSERIA GONORRHOEAE BY PCR: NEGATIVE

## 2016-06-22 ENCOUNTER — Telehealth: Payer: Self-pay | Admitting: Women's Health

## 2016-06-22 DIAGNOSIS — A749 Chlamydial infection, unspecified: Secondary | ICD-10-CM

## 2016-06-22 MED ORDER — AZITHROMYCIN 500 MG PO TABS: 1000.0000 mg | ORAL_TABLET | Freq: Once | ORAL | 0 refills | Status: AC

## 2016-06-22 NOTE — Telephone Encounter (Signed)
Called pt, notified her of +CT. Rx for azithromycin 1gm po x 1 sent to her pharmacy. Partner needs to be tx- can go to his pcp, or hd, or she can call me back w/ his info (name, dob, allergies, pharmacy). No sex x at least 7d from time both have taken meds. Return in 3-4wks to give us urine for poc.  Cheral MarkerKimberly R. Varnell Donate, CNM, [facility] 06/22/2016 2:57 PM

## 2016-08-17 ENCOUNTER — Ambulatory Visit: Payer: Medicaid Other | Admitting: Obstetrics & Gynecology

## 2016-08-23 ENCOUNTER — Ambulatory Visit: Payer: Medicaid Other | Admitting: Advanced Practice Midwife

## 2016-12-28 ENCOUNTER — Ambulatory Visit: Payer: 59 | Admitting: Adult Health

## 2016-12-28 ENCOUNTER — Encounter: Payer: Self-pay | Admitting: *Deleted

## 2017-05-01 ENCOUNTER — Ambulatory Visit (INDEPENDENT_AMBULATORY_CARE_PROVIDER_SITE_OTHER): Payer: Medicaid Other | Admitting: Adult Health

## 2017-05-01 ENCOUNTER — Encounter: Payer: Self-pay | Admitting: Adult Health

## 2017-05-01 VITALS — BP 100/60 | HR 94 | Ht 67.0 in | Wt 110.0 lb

## 2017-05-01 DIAGNOSIS — N760 Acute vaginitis: Secondary | ICD-10-CM | POA: Diagnosis not present

## 2017-05-01 DIAGNOSIS — Z975 Presence of (intrauterine) contraceptive device: Secondary | ICD-10-CM | POA: Insufficient documentation

## 2017-05-01 DIAGNOSIS — B9689 Other specified bacterial agents as the cause of diseases classified elsewhere: Secondary | ICD-10-CM | POA: Diagnosis not present

## 2017-05-01 DIAGNOSIS — N898 Other specified noninflammatory disorders of vagina: Secondary | ICD-10-CM | POA: Diagnosis not present

## 2017-05-01 DIAGNOSIS — L739 Follicular disorder, unspecified: Secondary | ICD-10-CM | POA: Insufficient documentation

## 2017-05-01 LAB — POCT WET PREP (WET MOUNT)
CLUE CELLS WET PREP WHIFF POC: POSITIVE
WBC WET PREP: POSITIVE

## 2017-05-01 MED ORDER — METRONIDAZOLE 500 MG PO TABS: 500.0000 mg | ORAL_TABLET | Freq: Two times a day (BID) | ORAL | 1 refills | Status: DC

## 2017-05-01 MED ORDER — SULFAMETHOXAZOLE-TRIMETHOPRIM 800-160 MG PO TABS: 1.0000 | ORAL_TABLET | Freq: Two times a day (BID) | ORAL | 1 refills | Status: DC

## 2017-05-01 NOTE — Progress Notes (Signed)
Subjective:     Patient ID: Cheryl Hebert, female   DOB: 10/06/1990, 27 y.o.   MRN: [id number]  HPI Cheryl Hebert is a 27 year old black female in complaining of vaginal discharge. Has history of BV, no new partners.   Review of Systems +vaginal discharge  Had itching but not so much now No new partners.  Reviewed past medical,surgical, social and family history. Reviewed medications and allergies.     Objective:   Physical Exam BP 100/60 (BP Location: Left Arm, Patient Position: Sitting, Cuff Size: Small)   Pulse 94   Ht 5\' 7"  (1.702 m)   Wt 110 lb (49.9 kg)   LMP 04/15/2017   BMI 17.23 kg/m  Skin warm and dry.Pelvic: external genitalia is normal in appearance, but has several areas of folliculitis, vagina: white discharge with odor,urethra has no lesions or masses noted, cervix:smooth and bulbous,+IUD strings at os, uterus: normal size, shape and contour, non tender, no masses felt, adnexa: no masses or tenderness noted. Bladder is non tender and no masses felt. Wet prep: + for clue cells and +WBCs. Nuswab obtained.    Assessment:     1. BV (bacterial vaginosis)   2. Vaginal discharge   3. Folliculitis   4. IUD (intrauterine device) in place       Plan:     Nuswab sent Meds ordered this encounter  Medications  . metroNIDAZOLE (FLAGYL) 500 MG tablet    Sig: Take 1 tablet (500 mg total) by mouth 2 (two) times daily.    Dispense:  14 tablet    Refill:  1    Order Specific Question:   Supervising Provider    Answer:   Despina HiddenEURE, LUTHER H [[id number]]  . sulfamethoxazole-trimethoprim (BACTRIM DS,SEPTRA DS) 800-160 MG tablet    Sig: Take 1 tablet by mouth 2 (two) times daily. Take 1 bid    Dispense:  28 tablet    Refill:  1    Order Specific Question:   Supervising Provider    Answer:   Lazaro ArmsEURE, LUTHER H [[id number]]  Review handout on BV No tub baths,thong panties, or shower gels, do not shave, clip F/U prn Has pap/physical  in May with Joellyn HaffKim Booker, CNM

## 2017-05-01 NOTE — Patient Instructions (Signed)
Bacterial Vaginosis Bacterial vaginosis is a vaginal infection that occurs when the normal balance of bacteria in the vagina is disrupted. It results from an overgrowth of certain bacteria. This is the most common vaginal infection among women ages 15-44. Because bacterial vaginosis increases your risk for STIs (sexually transmitted infections), getting treated can help reduce your risk for chlamydia, gonorrhea, herpes, and HIV (human immunodeficiency virus). Treatment is also important for preventing complications in pregnant women, because this condition can cause an early (premature) delivery. What are the causes? This condition is caused by an increase in harmful bacteria that are normally present in small amounts in the vagina. However, the reason that the condition develops is not fully understood. What increases the risk? The following factors may make you more likely to develop this condition:  Having a new sexual partner or multiple sexual partners.  Having unprotected sex.  Douching.  Having an intrauterine device (IUD).  Smoking.  Drug and alcohol abuse.  Taking certain antibiotic medicines.  Being pregnant.  You cannot get bacterial vaginosis from toilet seats, bedding, swimming pools, or contact with objects around you. What are the signs or symptoms? Symptoms of this condition include:  Grey or white vaginal discharge. The discharge can also be watery or foamy.  A fish-like odor with discharge, especially after sexual intercourse or during menstruation.  Itching in and around the vagina.  Burning or pain with urination.  Some women with bacterial vaginosis have no signs or symptoms. How is this diagnosed? This condition is diagnosed based on:  Your medical history.  A physical exam of the vagina.  Testing a sample of vaginal fluid under a microscope to look for a large amount of bad bacteria or abnormal cells. Your health care provider may use a cotton swab  or a small wooden spatula to collect the sample.  How is this treated? This condition is treated with antibiotics. These may be given as a pill, a vaginal cream, or a medicine that is put into the vagina (suppository). If the condition comes back after treatment, a second round of antibiotics may be needed. Follow these instructions at home: Medicines  Take over-the-counter and prescription medicines only as told by your health care provider.  Take or use your antibiotic as told by your health care provider. Do not stop taking or using the antibiotic even if you start to feel better. General instructions  If you have a female sexual partner, tell her that you have a vaginal infection. She should see her health care provider and be treated if she has symptoms. If you have a female sexual partner, he does not need treatment.  During treatment: ? Avoid sexual activity until you finish treatment. ? Do not douche. ? Avoid alcohol as directed by your health care provider. ? Avoid breastfeeding as directed by your health care provider.  Drink enough water and fluids to keep your urine clear or pale yellow.  Keep the area around your vagina and rectum clean. ? Wash the area daily with warm water. ? Wipe yourself from front to back after using the toilet.  Keep all follow-up visits as told by your health care provider. This is important. How is this prevented?  Do not douche.  Wash the outside of your vagina with warm water only.  Use protection when having sex. This includes latex condoms and dental dams.  Limit how many sexual partners you have. To help prevent bacterial vaginosis, it is best to have sex with just   one partner (monogamous).  Make sure you and your sexual partner are tested for STIs.  Wear cotton or cotton-lined underwear.  Avoid wearing tight pants and pantyhose, especially during summer.  Limit the amount of alcohol that you drink.  Do not use any products that  contain nicotine or tobacco, such as cigarettes and e-cigarettes. If you need help quitting, ask your health care provider.  Do not use illegal drugs. Where to find more information:  Centers for Disease Control and Prevention: www.cdc.gov/std  American Sexual Health Association (ASHA): www.ashastd.org  U.S. Department of Health and Human Services, Office on Women's Health: www.womenshealth.gov/ or https://www.womenshealth.gov/a-z-topics/bacterial-vaginosis Contact a health care provider if:  Your symptoms do not improve, even after treatment.  You have more discharge or pain when urinating.  You have a fever.  You have pain in your abdomen.  You have pain during sex.  You have vaginal bleeding between periods. Summary  Bacterial vaginosis is a vaginal infection that occurs when the normal balance of bacteria in the vagina is disrupted.  Because bacterial vaginosis increases your risk for STIs (sexually transmitted infections), getting treated can help reduce your risk for chlamydia, gonorrhea, herpes, and HIV (human immunodeficiency virus). Treatment is also important for preventing complications in pregnant women, because the condition can cause an early (premature) delivery.  This condition is treated with antibiotic medicines. These may be given as a pill, a vaginal cream, or a medicine that is put into the vagina (suppository). This information is not intended to replace advice given to you by your health care provider. Make sure you discuss any questions you have with your health care provider. Document Released: 12/25/2004 Document Revised: 04/30/2016 Document Reviewed: 09/10/2015 Elsevier Interactive Patient Education  2018 Elsevier Inc.  

## 2017-05-04 LAB — NUSWAB VAGINITIS PLUS (VG+)
ATOPOBIUM VAGINAE: HIGH {score} — AB
BVAB 2: HIGH {score} — AB
Candida albicans, NAA: NEGATIVE
Candida glabrata, NAA: NEGATIVE
Chlamydia trachomatis, NAA: NEGATIVE
MEGASPHAERA 1: HIGH {score} — AB
Neisseria gonorrhoeae, NAA: NEGATIVE
Trich vag by NAA: NEGATIVE

## 2017-05-27 ENCOUNTER — Other Ambulatory Visit: Payer: 59 | Admitting: Women's Health

## 2017-10-16 ENCOUNTER — Telehealth: Payer: Self-pay | Admitting: Adult Health

## 2017-10-16 ENCOUNTER — Telehealth: Payer: Self-pay | Admitting: *Deleted

## 2017-10-16 NOTE — Telephone Encounter (Signed)
Patient called stating that she has BV and would like for Victorino Dike to call her in something at the [facility] pharmacy in [place]. Please contact pt

## 2017-10-16 NOTE — Telephone Encounter (Signed)
LMOVM that there is a refill on the last metronidazole that was sent to pharmacy back in April.  Advised to call back if she still needs refill.

## 2017-10-16 NOTE — Telephone Encounter (Signed)
Returning your call. °

## 2017-10-16 NOTE — Telephone Encounter (Signed)
LMOVM returning patient's call.  

## 2017-12-04 ENCOUNTER — Encounter: Payer: Self-pay | Admitting: *Deleted

## 2017-12-04 ENCOUNTER — Ambulatory Visit (INDEPENDENT_AMBULATORY_CARE_PROVIDER_SITE_OTHER): Payer: 59 | Admitting: Women's Health

## 2017-12-04 ENCOUNTER — Encounter: Payer: Self-pay | Admitting: Women's Health

## 2017-12-04 VITALS — BP 124/74 | HR 110 | Ht 67.0 in | Wt 122.5 lb

## 2017-12-04 DIAGNOSIS — N920 Excessive and frequent menstruation with regular cycle: Secondary | ICD-10-CM | POA: Insufficient documentation

## 2017-12-04 DIAGNOSIS — N760 Acute vaginitis: Secondary | ICD-10-CM

## 2017-12-04 DIAGNOSIS — Z113 Encounter for screening for infections with a predominantly sexual mode of transmission: Secondary | ICD-10-CM

## 2017-12-04 DIAGNOSIS — R102 Pelvic and perineal pain: Secondary | ICD-10-CM | POA: Diagnosis not present

## 2017-12-04 DIAGNOSIS — B9689 Other specified bacterial agents as the cause of diseases classified elsewhere: Secondary | ICD-10-CM | POA: Diagnosis not present

## 2017-12-04 LAB — POCT WET PREP (WET MOUNT)
Clue Cells Wet Prep Whiff POC: POSITIVE
Trichomonas Wet Prep HPF POC: ABSENT

## 2017-12-04 LAB — POCT URINE PREGNANCY: Preg Test, Ur: NEGATIVE

## 2017-12-04 MED ORDER — METRONIDAZOLE 0.75 % VA GEL: 1.0000 | Freq: Every day | VAGINAL | 0 refills | Status: DC

## 2017-12-04 NOTE — Patient Instructions (Signed)

## 2017-12-04 NOTE — Addendum Note (Signed)
Addended by: Moss McRESENZO, Jaylin Benzel M on: 12/04/2017 11:12 AM   Modules accepted: Orders

## 2017-12-04 NOTE — Progress Notes (Signed)
   GYN VISIT Patient name: Cheryl Hebert MRN [id number]  Date of birth: 10/05/1990 Chief Complaint:   BC problem (right lower quad pain, spotting)  History of Present Illness:   Cheryl Ferriesaryn Spaid is a 27 y.o. [phone] African American female being seen today for report of spotting, pelvic pain R>L, nausea, vulvar itching/irritation/odor x 1wk. Worried she is pregnant. Has had change in sex partner recently. Mirena inserted 03/02/16.   No LMP recorded. (Menstrual status: IUD). The current method of family planning is IUD. Last pap 02/28/15. Results were:  normal Review of Systems:   Pertinent items are noted in HPI Denies fever/chills, dizziness, headaches, visual disturbances, fatigue, shortness of breath, chest pain, abdominal pain, vomiting, abnormal vaginal discharge/itching/odor/irritation, problems with periods, bowel movements, urination, or intercourse unless otherwise stated above.  Pertinent History Reviewed:  Reviewed past medical,surgical, social, obstetrical and family history.  Reviewed problem list, medications and allergies. Physical Assessment:   Vitals:   12/04/17 1019  BP: 124/74  Pulse: (!) 110  Weight: 122 lb 8 oz (55.6 kg)  Height: 5\' 7"  (1.702 m)  Body mass index is 19.19 kg/m.       Physical Examination:   General appearance: alert, well appearing, and in no distress  Mental status: alert, oriented to person, place, and time  Skin: warm & dry   Cardiovascular: normal heart rate noted  Respiratory: normal respiratory effort, no distress  Abdomen: soft, non-tender   Pelvic: VULVA: normal appearing vulva with no masses, tenderness or lesions, VAGINA: normal appearing vagina with normal color and thin white malodorous discharge, no lesions, CERVIX: normal appearing cervix without discharge or lesions, IUD strings visible  Extremities: no edema  Informal transabdominal u/s: IUD in correct fundal placement w/in endometrium  UPT: neg  Results for orders placed or  performed in visit on 12/04/17 (from the past 24 hour(s))  POCT Wet Prep Mellody Drown([place] [place])   Collection Time: 12/04/17 11:06 AM  Result Value Ref Range   Source Wet Prep POC vaginal    WBC, Wet Prep HPF POC mod    Bacteria Wet Prep HPF POC Few Few   BACTERIA WET PREP MORPHOLOGY POC     Clue Cells Wet Prep HPF POC Many (A) None   Clue Cells Wet Prep Whiff POC Positive Whiff    Yeast Wet Prep HPF POC None None   KOH Wet Prep POC     Trichomonas Wet Prep HPF POC Absent Absent    Assessment & Plan:  1) BV> rx metrogel per pt preference, no sex or etoh while taking  2) Spotting & pelvic pain> likely from BV  3) STD screen> send gc/ct   Meds:  Meds ordered this encounter  Medications  . metroNIDAZOLE (METROGEL VAGINAL) 0.75 % vaginal gel    Sig: Place 1 Applicatorful vaginally at bedtime. X 5 nights, no alcohol or sex while using    Dispense:  70 g    Refill:  0    Order Specific Question:   Supervising Provider    Answer:   Duane LopeEURE, LUTHER H [[id number]]    Orders Placed This Encounter  Procedures  . GC/Chlamydia Probe Amp  . POCT Wet Prep [facility]([place] [place])    Return for Feb for , Pap & physical.  Cheral MarkerKimberly R Gwenn Teodoro CNM, [facility] 12/04/2017 11:06 AM

## 2017-12-08 LAB — GC/CHLAMYDIA PROBE AMP
Chlamydia trachomatis, NAA: POSITIVE — AB
Neisseria gonorrhoeae by PCR: POSITIVE — AB

## 2017-12-09 ENCOUNTER — Other Ambulatory Visit: Payer: Self-pay | Admitting: Women's Health

## 2017-12-09 ENCOUNTER — Telehealth: Payer: Self-pay | Admitting: *Deleted

## 2017-12-09 ENCOUNTER — Encounter: Payer: Self-pay | Admitting: Women's Health

## 2017-12-09 MED ORDER — AZITHROMYCIN 500 MG PO TABS: 1000.0000 mg | ORAL_TABLET | Freq: Once | ORAL | 0 refills | Status: AC

## 2017-12-09 NOTE — Telephone Encounter (Signed)
Patient informed she has gonorrhea and chlamydia.  Will need to come in for Rocephin injection and bring Zithromax from pharmacy with her to appt. Partner needs treatment but will need to go to HD or PCP.  Pt verbalized understanding and will come in the am for injection.

## 2017-12-10 ENCOUNTER — Ambulatory Visit (INDEPENDENT_AMBULATORY_CARE_PROVIDER_SITE_OTHER): Payer: 59

## 2017-12-10 ENCOUNTER — Encounter: Payer: Self-pay | Admitting: Obstetrics & Gynecology

## 2017-12-10 VITALS — Ht 67.0 in | Wt 124.0 lb

## 2017-12-10 DIAGNOSIS — A549 Gonococcal infection, unspecified: Secondary | ICD-10-CM

## 2017-12-10 MED ORDER — CEFTRIAXONE SODIUM 250 MG IJ SOLR
250.0000 mg | Freq: Once | INTRAMUSCULAR | Status: AC
  Administered 2017-12-10: 250 mg via INTRAMUSCULAR

## 2017-12-10 NOTE — Progress Notes (Signed)
Pt here for rocephin 250 mg IM given lt VG. Tolerated well. Took Zithromax 2 tables before injection was given. +gonorrhea.Pt will wait 15 minutes before leaving.pad CMA

## 2018-01-02 ENCOUNTER — Other Ambulatory Visit: Payer: 59

## 2018-01-02 DIAGNOSIS — Z8619 Personal history of other infectious and parasitic diseases: Secondary | ICD-10-CM | POA: Diagnosis not present

## 2018-01-04 LAB — GC/CHLAMYDIA PROBE AMP
Chlamydia trachomatis, NAA: NEGATIVE
Neisseria gonorrhoeae by PCR: NEGATIVE

## 2018-01-21 ENCOUNTER — Other Ambulatory Visit: Payer: 59 | Admitting: Obstetrics & Gynecology

## 2018-03-04 ENCOUNTER — Other Ambulatory Visit (HOSPITAL_COMMUNITY)
Admission: RE | Admit: 2018-03-04 | Discharge: 2018-03-04 | Disposition: A | Discharge status: Home / Self Care | Payer: 59 | Source: Ambulatory Visit | Attending: Obstetrics & Gynecology | Admitting: Obstetrics & Gynecology

## 2018-03-04 ENCOUNTER — Encounter: Payer: Self-pay | Admitting: Women's Health

## 2018-03-04 ENCOUNTER — Ambulatory Visit (INDEPENDENT_AMBULATORY_CARE_PROVIDER_SITE_OTHER): Payer: 59 | Admitting: Women's Health

## 2018-03-04 ENCOUNTER — Other Ambulatory Visit: Payer: Self-pay

## 2018-03-04 VITALS — BP 124/65 | HR 63 | Ht 67.0 in | Wt 125.0 lb

## 2018-03-04 DIAGNOSIS — Z113 Encounter for screening for infections with a predominantly sexual mode of transmission: Secondary | ICD-10-CM | POA: Diagnosis not present

## 2018-03-04 DIAGNOSIS — Z01419 Encounter for gynecological examination (general) (routine) without abnormal findings: Secondary | ICD-10-CM | POA: Insufficient documentation

## 2018-03-04 NOTE — Progress Notes (Signed)
   WELL-WOMAN EXAMINATION Patient name: Cheryl Hebert MRN [id number]  Date of birth: Jul 25, 1990 Chief Complaint:   Gynecologic Exam  History of Present Illness:   Cheryl Hebert is a 28 y.o. [phone] African American female being seen today for a routine well-woman exam.  Current complaints: seems to get BV a lot, is using Dove soap but does have scent Keloids on mons for years from shaving  PCP: none      does desire labs, including STD screen No LMP recorded. (Menstrual status: IUD). The current method of family planning is Mirena IUD inserted 03/02/16 Last pap 02/28/15. Results were: normal Last mammogram: never. Results were: n/a. Family h/o breast cancer: No Last colonoscopy: never. Results were: n/a. Family h/o colorectal cancer: No Review of Systems:   Pertinent items are noted in HPI Denies any headaches, blurred vision, fatigue, shortness of breath, chest pain, abdominal pain, abnormal vaginal discharge/itching/odor/irritation, problems with periods, bowel movements, urination, or intercourse unless otherwise stated above. Pertinent History Reviewed:  Reviewed past medical,surgical, social and family history.  Reviewed problem list, medications and allergies. Physical Assessment:   Vitals:   03/04/18 1456  BP: 124/65  Pulse: 63  Weight: 125 lb (56.7 kg)  Height: 5\' 7"  (1.702 m)  Body mass index is 19.58 kg/m.        Physical Examination:   General appearance - well appearing, and in no distress  Mental status - alert, oriented to person, place, and time  Psych:  She has a normal mood and affect  Skin - warm and dry, normal color, no suspicious lesions noted  Chest - effort normal, all lung fields clear to auscultation bilaterally  Heart - normal rate and regular rhythm  Neck:  midline trachea, no thyromegaly or nodules  Breasts - breasts appear normal, no suspicious masses, no skin or nipple changes or  axillary nodes  Abdomen - soft, nontender, nondistended, no masses or  organomegaly  Pelvic - VULVA: multiple large keloids on mons, otherwise normal appearing vulva with no masses, tenderness or lesions  VAGINA: normal appearing vagina with normal color and discharge, no lesions  CERVIX: normal appearing cervix without discharge or lesions, no CMT, strings visible  Thin prep pap is done w/ HR HPV cotesting  UTERUS: uterus is felt to be normal size, shape, consistency and nontender   ADNEXA: No adnexal masses or tenderness noted.  Extremities:  No swelling or varicosities noted  No results found for this or any previous visit (from the past 24 hour(s)).  Assessment & Plan:  1) Well-Woman Exam  2) Frequent BV> stop using soaps w/ scents/perfumes, etc, try Dove Sensitive, RepHresh, etc. Let me know if happening after sex, can try metrogel postcoital  3) Keloids on mons> from shaving, try waxing  Labs/procedures today: pap w/ gc/ct, as below  Mammogram @27yo  or sooner if problems Colonoscopy @45 -50yo or sooner if problems  Orders Placed This Encounter  Procedures  . CBC  . Comprehensive metabolic panel  . TSH  . HIV Antibody (routine testing w rflx)  . RPR  . Hepatitis B surface antigen    Follow-up: No follow-ups on file.  Cheral Marker CNM, [facility] 03/04/2018 3:24 PM

## 2018-03-05 LAB — RPR: RPR Ser Ql: NONREACTIVE

## 2018-03-05 LAB — CBC
Hematocrit: 42.3 % (ref 34.0–46.6)
Hemoglobin: 13.1 g/dL (ref 11.1–15.9)
MCH: 23.8 pg — ABNORMAL LOW (ref 26.6–33.0)
MCHC: 31 g/dL — AB (ref 31.5–35.7)
MCV: 77 fL — ABNORMAL LOW (ref 79–97)
Platelets: 321 10*3/uL (ref 150–450)
RBC: 5.51 x10E6/uL — ABNORMAL HIGH (ref 3.77–5.28)
RDW: 12.8 % (ref 11.7–15.4)
WBC: 5.6 10*3/uL (ref 3.4–10.8)

## 2018-03-05 LAB — COMPREHENSIVE METABOLIC PANEL
ALK PHOS: 57 IU/L (ref 39–117)
ALT: 23 IU/L (ref 0–32)
AST: 17 IU/L (ref 0–40)
Albumin/Globulin Ratio: 2.2 (ref 1.2–2.2)
Albumin: 5.2 g/dL — ABNORMAL HIGH (ref 3.9–5.0)
BUN/Creatinine Ratio: 21 (ref 9–23)
BUN: 14 mg/dL (ref 6–20)
Bilirubin Total: 0.8 mg/dL (ref 0.0–1.2)
CO2: 18 mmol/L — ABNORMAL LOW (ref 20–29)
Calcium: 9.9 mg/dL (ref 8.7–10.2)
Chloride: 103 mmol/L (ref 96–106)
Creatinine, Ser: 0.68 mg/dL (ref 0.57–1.00)
GFR calc Af Amer: 139 mL/min/{1.73_m2} (ref >59)
GFR calc non Af Amer: 120 mL/min/{1.73_m2} (ref >59)
Globulin, Total: 2.4 g/dL (ref 1.5–4.5)
Glucose: 81 mg/dL (ref 65–99)
Potassium: 3.8 mmol/L (ref 3.5–5.2)
Sodium: 139 mmol/L (ref 134–144)
Total Protein: 7.6 g/dL (ref 6.0–8.5)

## 2018-03-05 LAB — HEPATITIS B SURFACE ANTIGEN: Hepatitis B Surface Ag: NEGATIVE

## 2018-03-05 LAB — HIV ANTIBODY (ROUTINE TESTING W REFLEX): HIV Screen 4th Generation wRfx: NONREACTIVE

## 2018-03-05 LAB — TSH: TSH: 1.51 u[IU]/mL (ref 0.450–4.500)

## 2018-03-10 LAB — CYTOLOGY - PAP
Chlamydia: NEGATIVE
Diagnosis: UNDETERMINED — AB
HPV: DETECTED — AB
NEISSERIA GONORRHEA: POSITIVE — AB

## 2018-03-11 ENCOUNTER — Telehealth: Payer: Self-pay | Admitting: *Deleted

## 2018-03-11 ENCOUNTER — Other Ambulatory Visit: Payer: Self-pay | Admitting: Women's Health

## 2018-03-11 ENCOUNTER — Encounter: Payer: Self-pay | Admitting: Women's Health

## 2018-03-11 DIAGNOSIS — R87619 Unspecified abnormal cytological findings in specimens from cervix uteri: Secondary | ICD-10-CM | POA: Insufficient documentation

## 2018-03-11 MED ORDER — AZITHROMYCIN 500 MG PO TABS: 1000.0000 mg | ORAL_TABLET | Freq: Once | ORAL | 0 refills | Status: AC

## 2018-03-11 NOTE — Telephone Encounter (Signed)
Patient returned call

## 2018-03-11 NOTE — Telephone Encounter (Signed)
Called patient and left message that I am calling with results.  

## 2018-03-13 ENCOUNTER — Ambulatory Visit (INDEPENDENT_AMBULATORY_CARE_PROVIDER_SITE_OTHER): Payer: 59 | Admitting: *Deleted

## 2018-03-13 ENCOUNTER — Encounter: Payer: Self-pay | Admitting: *Deleted

## 2018-03-13 ENCOUNTER — Other Ambulatory Visit: Payer: Self-pay

## 2018-03-13 DIAGNOSIS — A549 Gonococcal infection, unspecified: Secondary | ICD-10-CM

## 2018-03-13 MED ORDER — CEFTRIAXONE SODIUM 250 MG IJ SOLR
250.0000 mg | Freq: Once | INTRAMUSCULAR | Status: AC
  Administered 2018-03-13: 250 mg via INTRAMUSCULAR

## 2018-03-13 NOTE — Telephone Encounter (Signed)
Pt informed of results. She will come today for rocephin and will schedule colpo before leaving today.

## 2018-03-13 NOTE — Telephone Encounter (Signed)
I called patient back and she answered and then put me on hold, I had to disconnect call to assist another patient.

## 2018-03-13 NOTE — Telephone Encounter (Signed)
Called patient back and left message that I am returning her call.  

## 2018-03-13 NOTE — Progress Notes (Signed)
Pt given Rocephin 250mg  IM left VG without complications. Advised to return in 3-4 weeks for POC and colpo. Pt verbalized understanding.

## 2018-03-21 ENCOUNTER — Other Ambulatory Visit: Payer: Self-pay | Admitting: Women's Health

## 2018-04-02 ENCOUNTER — Telehealth: Payer: Self-pay | Admitting: *Deleted

## 2018-04-02 MED ORDER — METRONIDAZOLE 0.75 % VA GEL: 1.0000 | Freq: Every day | VAGINAL | 0 refills | Status: DC

## 2018-04-02 NOTE — Telephone Encounter (Signed)
Patient called [facility] a [facility] [place] patient states she needs the gel pill for bacteria infection patient does not know the name. Please advise [phone]  Patient states she did request through her pharmacy but she don't think they sent it

## 2018-04-02 NOTE — Telephone Encounter (Signed)
Spoke with pt. Pt is requesting cream for BV. Pt is having vaginal irritation. Has had the cream before. Can't remember the name. Please advise. Thanks!! JSY

## 2018-04-02 NOTE — Telephone Encounter (Signed)
Will rx metrogel  

## 2018-04-10 ENCOUNTER — Telehealth: Payer: Self-pay | Admitting: *Deleted

## 2018-04-10 NOTE — Telephone Encounter (Signed)
Patient informed that we are not allowing visitors or children to come to appointments at this time. Patient denies any contact with anyone suspected or confirmed of having COVID-19. Pt denies fever, cough, sob, muscle pain, diarrhea, rash, vomiting, abdominal pain, red eye, weakness, bruising or bleeding, joint pain or severe headache.  

## 2018-04-11 ENCOUNTER — Encounter: Payer: 59 | Admitting: Obstetrics & Gynecology

## 2018-05-01 ENCOUNTER — Other Ambulatory Visit: Payer: Self-pay | Admitting: Women's Health

## 2018-05-01 MED ORDER — METRONIDAZOLE 0.75 % VA GEL: 1.0000 | Freq: Every day | VAGINAL | 0 refills | Status: DC

## 2018-05-08 ENCOUNTER — Encounter: Payer: Self-pay | Admitting: *Deleted

## 2018-05-09 ENCOUNTER — Ambulatory Visit (INDEPENDENT_AMBULATORY_CARE_PROVIDER_SITE_OTHER): Payer: 59 | Admitting: Obstetrics & Gynecology

## 2018-05-09 ENCOUNTER — Encounter: Payer: Self-pay | Admitting: Obstetrics & Gynecology

## 2018-05-09 ENCOUNTER — Other Ambulatory Visit: Payer: Self-pay

## 2018-05-09 VITALS — BP 116/79 | HR 76 | Temp 98.3°F | Ht 67.0 in | Wt 148.0 lb

## 2018-05-09 DIAGNOSIS — A549 Gonococcal infection, unspecified: Secondary | ICD-10-CM

## 2018-05-09 DIAGNOSIS — Z3202 Encounter for pregnancy test, result negative: Secondary | ICD-10-CM

## 2018-05-09 DIAGNOSIS — R87810 Cervical high risk human papillomavirus (HPV) DNA test positive: Secondary | ICD-10-CM | POA: Diagnosis not present

## 2018-05-09 DIAGNOSIS — R87610 Atypical squamous cells of undetermined significance on cytologic smear of cervix (ASC-US): Secondary | ICD-10-CM

## 2018-05-09 LAB — POCT URINE PREGNANCY: Preg Test, Ur: NEGATIVE

## 2018-05-09 NOTE — Progress Notes (Signed)
Colposcopy Procedure Note:  Colposcopy Procedure Note  Indications: Pap smear 2 months ago showed: ASCUS with POSITIVE high risk HPV. The prior pap showed no abnormalities.  Prior cervical/vaginal disease: . Prior cervical treatment: .  Smoker:  No. New sexual partner:  No.    History of abnormal Pap: no  Procedure Details  The risks and benefits of the procedure and Written informed consent obtained.  Speculum placed in vagina and excellent visualization of cervix achieved, cervix swabbed x 3 with acetic acid solution.  Findings: Cervix: no visible lesions, no mosaicism, no punctation and no abnormal vasculature; SCJ visualized 360 degrees without lesions and no biopsies taken. Vaginal inspection: normal without visible lesions. Vulvar colposcopy: vulvar colposcopy not performed.  Specimens: none  Complications: none.  Plan: Repeat Pap 1 year

## 2018-05-11 LAB — GC/CHLAMYDIA PROBE AMP
Chlamydia trachomatis, NAA: NEGATIVE
Neisseria Gonorrhoeae by PCR: NEGATIVE

## 2018-08-20 ENCOUNTER — Telehealth: Payer: Self-pay | Admitting: Obstetrics & Gynecology

## 2018-08-20 NOTE — Telephone Encounter (Signed)
Pt states that she was seen on 05/09/2018 but did not pick up the medication for her infection until a few weeks ago. She states that medication has not helped her abnormal discharge. Please advise.

## 2018-08-20 NOTE — Telephone Encounter (Signed)
Left message @ 3:50 pm. JSY

## 2018-08-21 NOTE — Telephone Encounter (Addendum)
Pt was seen in May and was given med for BV. Pt didn't get prescription then. She didn't start med until July. Pt felt like med helped but she is having a vaginal discharge with some odor again and is wanting med for BV. Pt wants oral med. Please advise. Thanks!! Wallace

## 2018-08-22 ENCOUNTER — Telehealth: Payer: Self-pay | Admitting: Obstetrics & Gynecology

## 2018-08-22 MED ORDER — METRONIDAZOLE 500 MG PO TABS: 500.0000 mg | ORAL_TABLET | Freq: Two times a day (BID) | ORAL | 0 refills | Status: DC

## 2018-08-22 NOTE — Telephone Encounter (Signed)
done

## 2018-12-08 ENCOUNTER — Ambulatory Visit: Payer: 59 | Admitting: Adult Health

## 2018-12-11 ENCOUNTER — Encounter: Payer: Self-pay | Admitting: Adult Health

## 2018-12-11 ENCOUNTER — Ambulatory Visit (INDEPENDENT_AMBULATORY_CARE_PROVIDER_SITE_OTHER): Payer: 59 | Admitting: Adult Health

## 2018-12-11 ENCOUNTER — Other Ambulatory Visit: Payer: Self-pay

## 2018-12-11 VITALS — BP 108/67 | HR 66 | Ht 67.0 in | Wt 141.5 lb

## 2018-12-11 DIAGNOSIS — L720 Epidermal cyst: Secondary | ICD-10-CM | POA: Insufficient documentation

## 2018-12-11 NOTE — Patient Instructions (Signed)
Epidermal Cyst  An epidermal cyst is a small, painless lump under your skin. The cyst contains a grayish-white, bad-smelling substance (keratin). Do not try to pop or open an epidermal cyst yourself. What are the causes?  A blocked hair follicle.  A hair that curls and re-enters the skin instead of growing straight out of the skin.  A blocked pore.  Irritated skin.  An injury to the skin.  Certain conditions that are passed along from parent to child (inherited).  Human papillomavirus (HPV).  Long-term sun damage to the skin. What increases the risk?  Having acne.  Being overweight.  Being 30-40 years old. What are the signs or symptoms? These cysts are usually harmless, but they can get infected. Symptoms of infection may include:  Redness.  Inflammation.  Tenderness.  Warmth.  Fever.  A grayish-white, bad-smelling substance drains from the cyst.  Pus drains from the cyst. How is this treated? In many cases, epidermal cysts go away on their own without treatment. If a cyst becomes infected, treatment may include:  Opening and draining the cyst, done by a doctor. After draining, you may need minor surgery to remove the rest of the cyst.  Antibiotic medicine.  Shots of medicines (steroids) that help to reduce inflammation.  Surgery to remove the cyst. Surgery may be done if the cyst: ? Becomes large. ? Bothers you. ? Has a chance of turning into cancer.  Do not try to open a cyst yourself. Follow these instructions at home:  Take over-the-counter and prescription medicines only as told by your doctor.  If you were prescribed an antibiotic medicine, take it it as told by your doctor. Do not stop using the antibiotic even if you start to feel better.  Keep the area around your cyst clean and dry.  Wear loose, dry clothing.  Avoid touching your cyst.  Check your cyst every day for signs of infection. Check for: ? Redness, swelling, or pain. ? Fluid  or blood. ? Warmth. ? Pus or a bad smell.  Keep all follow-up visits as told by your doctor. This is important. How is this prevented?  Wear clean, dry, clothing.  Avoid wearing tight clothing.  Keep your skin clean and dry. Take showers or baths every day. Contact a doctor if:  Your cyst has symptoms of infection.  Your condition does not improve or gets worse.  You have a cyst that looks different from other cysts you have had.  You have a fever. Get help right away if:  Redness spreads from the cyst into the area close by. Summary  An epidermal cyst is a sac made of skin tissue.  If a cyst becomes infected, treatment may include surgery to open and drain the cyst, or to remove it.  Take over-the-counter and prescription medicines only as told by your doctor.  Contact a doctor if your condition is not improving or is getting worse.  Keep all follow-up visits as told by your doctor. This is important. This information is not intended to replace advice given to you by your health care provider. Make sure you discuss any questions you have with your health care provider. Document Released: 02/02/2004 Document Revised: 04/17/2018 Document Reviewed: 10/03/2017 Elsevier Patient Education  2020 Elsevier Inc.  

## 2018-12-11 NOTE — Progress Notes (Signed)
  Subjective:     Patient ID: Shantelle Alles, female   DOB: 07-10-1990, 28 y.o.   MRN: [id number]  HPI Guenevere is a 28 year old black female, single, G3P121, in for ?lump in left breat for about a month.   Review of Systems ?lump in left breast for about a month, is non tender, no pain  Reviewed past medical,surgical, social and family history. Reviewed medications and allergies.     Objective:   Physical Exam BP 108/67 (BP Location: Left Arm, Patient Position: Sitting, Cuff Size: Normal)   Pulse 66   Ht 5\' 7"  (1.702 m)   Wt 141 lb 8 oz (64.2 kg)   BMI 22.16 kg/m   Fall risk is low  Skin warm and dry,  Breasts:no dominate palpable mass, retraction or nipple discharge Has BB sized epidermal cyst at about 6 0 clock left areola     Assessment:     Epidermal cyst left areola    Plan:   Just leave it alone  Will follow up prn Review handout on epidermal cyst

## 2018-12-17 ENCOUNTER — Other Ambulatory Visit: Payer: Self-pay

## 2018-12-17 DIAGNOSIS — Z20822 Contact with and (suspected) exposure to covid-19: Secondary | ICD-10-CM

## 2018-12-18 LAB — NOVEL CORONAVIRUS, NAA: SARS-CoV-2, NAA: NOT DETECTED

## 2019-01-14 ENCOUNTER — Ambulatory Visit: Payer: 59 | Admitting: Adult Health

## 2019-01-14 ENCOUNTER — Other Ambulatory Visit: Payer: 59

## 2019-01-20 ENCOUNTER — Other Ambulatory Visit: Payer: Medicaid Other

## 2019-02-20 ENCOUNTER — Ambulatory Visit (INDEPENDENT_AMBULATORY_CARE_PROVIDER_SITE_OTHER): Payer: 59 | Admitting: Adult Health

## 2019-02-20 ENCOUNTER — Encounter: Payer: Self-pay | Admitting: Adult Health

## 2019-02-20 ENCOUNTER — Other Ambulatory Visit: Payer: Self-pay

## 2019-02-20 VITALS — BP 110/72 | HR 58 | Ht 67.0 in | Wt 148.0 lb

## 2019-02-20 DIAGNOSIS — Z113 Encounter for screening for infections with a predominantly sexual mode of transmission: Secondary | ICD-10-CM | POA: Insufficient documentation

## 2019-02-20 DIAGNOSIS — N760 Acute vaginitis: Secondary | ICD-10-CM

## 2019-02-20 DIAGNOSIS — N898 Other specified noninflammatory disorders of vagina: Secondary | ICD-10-CM | POA: Insufficient documentation

## 2019-02-20 DIAGNOSIS — B9689 Other specified bacterial agents as the cause of diseases classified elsewhere: Secondary | ICD-10-CM

## 2019-02-20 DIAGNOSIS — Z975 Presence of (intrauterine) contraceptive device: Secondary | ICD-10-CM

## 2019-02-20 LAB — POCT WET PREP (WET MOUNT)
Clue Cells Wet Prep Whiff POC: NEGATIVE
WBC, Wet Prep HPF POC: POSITIVE

## 2019-02-20 MED ORDER — METRONIDAZOLE 0.75 % VA GEL: 1.0000 | Freq: Every day | VAGINAL | 1 refills | Status: DC

## 2019-02-20 NOTE — Progress Notes (Signed)
  Subjective:     Patient ID: Cheryl Hebert, female   DOB: 1990-04-10, 29 y.o.   MRN: [id number]  HPI Cheryl Hebert is a 29 year old black female, single, [id number], in complaining of vaginal discharge with itching and odor.She has an IUD.  Review of Systems +vaginal discharge +vaginal itching  +vaginal odor Has not had sex in a while  Reviewed past medical,surgical, social and family history. Reviewed medications and allergies.     Objective:   Physical Exam BP 110/72 (BP Location: Right Arm, Patient Position: Sitting, Cuff Size: Normal)   Pulse (!) 58   Ht 5\' 7"  (1.702 m)   Wt 148 lb (67.1 kg)   BMI 23.18 kg/m   Skin warm and dry.Pelvic: external genitalia is normal in appearance no lesions, vagina: white discharge without odor,urethra has no lesions or masses noted, cervix: bulbous,+IUD strings at os, uterus: normal size, shape and contour, non tender, no masses felt, adnexa: no masses or tenderness noted. Bladder is non tender and no masses felt. Wet prep: + for clue cells and +WBCs. Nuswab obtained. Examination chaperoned by CMA.     Assessment:     1. Vaginal discharge Rx Metrogel Nuswab sent  2. IUD (intrauterine device) in place She is aware that having IUD can increase chance of having BV   3. BV (bacterial vaginosis) Nuswab sent Rx Metrogel Meds ordered this encounter  Medications  . metroNIDAZOLE (METROGEL VAGINAL) 0.75 % vaginal gel    Sig: Place 1 Applicatorful vaginally at bedtime.    Dispense:  70 g    Refill:  1    Order Specific Question:   Supervising Provider    Answer:   Federico Flake H [[id number]]   Review handout on BV  4. Screen for STD (sexually transmitted disease) Nuswab sent   5. Vaginal itching     Plan:     Return in 4 weeks for pap and physical

## 2019-02-20 NOTE — Patient Instructions (Signed)
Bacterial Vaginosis  Bacterial vaginosis is a vaginal infection that occurs when the normal balance of bacteria in the vagina is disrupted. It results from an overgrowth of certain bacteria. This is the most common vaginal infection among women ages 15-44. Because bacterial vaginosis increases your risk for STIs (sexually transmitted infections), getting treated can help reduce your risk for chlamydia, gonorrhea, herpes, and HIV (human immunodeficiency virus). Treatment is also important for preventing complications in pregnant women, because this condition can cause an early (premature) delivery. What are the causes? This condition is caused by an increase in harmful bacteria that are normally present in small amounts in the vagina. However, the reason that the condition develops is not fully understood. What increases the risk? The following factors may make you more likely to develop this condition:  Having a new sexual partner or multiple sexual partners.  Having unprotected sex.  Douching.  Having an intrauterine device (IUD).  Smoking.  Drug and alcohol abuse.  Taking certain antibiotic medicines.  Being pregnant. You cannot get bacterial vaginosis from toilet seats, bedding, swimming pools, or contact with objects around you. What are the signs or symptoms? Symptoms of this condition include:  Grey or white vaginal discharge. The discharge can also be watery or foamy.  A fish-like odor with discharge, especially after sexual intercourse or during menstruation.  Itching in and around the vagina.  Burning or pain with urination. Some women with bacterial vaginosis have no signs or symptoms. How is this diagnosed? This condition is diagnosed based on:  Your medical history.  A physical exam of the vagina.  Testing a sample of vaginal fluid under a microscope to look for a large amount of bad bacteria or abnormal cells. Your health care provider may use a cotton swab or  a small wooden spatula to collect the sample. How is this treated? This condition is treated with antibiotics. These may be given as a pill, a vaginal cream, or a medicine that is put into the vagina (suppository). If the condition comes back after treatment, a second round of antibiotics may be needed. Follow these instructions at home: Medicines  Take over-the-counter and prescription medicines only as told by your health care provider.  Take or use your antibiotic as told by your health care provider. Do not stop taking or using the antibiotic even if you start to feel better. General instructions  If you have a female sexual partner, tell her that you have a vaginal infection. She should see her health care provider and be treated if she has symptoms. If you have a female sexual partner, he does not need treatment.  During treatment: ? Avoid sexual activity until you finish treatment. ? Do not douche. ? Avoid alcohol as directed by your health care provider. ? Avoid breastfeeding as directed by your health care provider.  Drink enough water and fluids to keep your urine clear or pale yellow.  Keep the area around your vagina and rectum clean. ? Wash the area daily with warm water. ? Wipe yourself from front to back after using the toilet.  Keep all follow-up visits as told by your health care provider. This is important. How is this prevented?  Do not douche.  Wash the outside of your vagina with warm water only.  Use protection when having sex. This includes latex condoms and dental dams.  Limit how many sexual partners you have. To help prevent bacterial vaginosis, it is best to have sex with just one partner (  monogamous).  Make sure you and your sexual partner are tested for STIs.  Wear cotton or cotton-lined underwear.  Avoid wearing tight pants and pantyhose, especially during summer.  Limit the amount of alcohol that you drink.  Do not use any products that contain  nicotine or tobacco, such as cigarettes and e-cigarettes. If you need help quitting, ask your health care provider.  Do not use illegal drugs. Where to find more information  Centers for Disease Control and Prevention: www.cdc.gov/std  American Sexual Health Association (ASHA): www.ashastd.org  U.S. Department of Health and Human Services, Office on Women's Health: www.womenshealth.gov/ or https://www.womenshealth.gov/a-z-topics/bacterial-vaginosis Contact a health care provider if:  Your symptoms do not improve, even after treatment.  You have more discharge or pain when urinating.  You have a fever.  You have pain in your abdomen.  You have pain during sex.  You have vaginal bleeding between periods. Summary  Bacterial vaginosis is a vaginal infection that occurs when the normal balance of bacteria in the vagina is disrupted.  Because bacterial vaginosis increases your risk for STIs (sexually transmitted infections), getting treated can help reduce your risk for chlamydia, gonorrhea, herpes, and HIV (human immunodeficiency virus). Treatment is also important for preventing complications in pregnant women, because the condition can cause an early (premature) delivery.  This condition is treated with antibiotic medicines. These may be given as a pill, a vaginal cream, or a medicine that is put into the vagina (suppository). This information is not intended to replace advice given to you by your health care provider. Make sure you discuss any questions you have with your health care provider. Document Revised: 12/07/2016 Document Reviewed: 09/10/2015 Elsevier Patient Education  2020 Elsevier Inc.  

## 2019-02-23 ENCOUNTER — Telehealth: Payer: Self-pay | Admitting: *Deleted

## 2019-02-23 LAB — NUSWAB VAGINITIS PLUS (VG+)
Candida albicans, NAA: NEGATIVE
Candida glabrata, NAA: NEGATIVE
Chlamydia trachomatis, NAA: NEGATIVE
Neisseria gonorrhoeae, NAA: NEGATIVE
Trich vag by NAA: NEGATIVE

## 2019-02-23 MED ORDER — METRONIDAZOLE 0.75 % VA GEL: 1.0000 | Freq: Every day | VAGINAL | 1 refills | Status: DC

## 2019-02-23 NOTE — Telephone Encounter (Signed)
Patient left messaging having issue with current pharmacy and needs medication for BV sent to different pharmacy.

## 2019-02-23 NOTE — Telephone Encounter (Signed)
Patient called requesting metrogel be sent to [facility] in [place] as [facility]  Medication resent.

## 2019-03-19 ENCOUNTER — Other Ambulatory Visit: Payer: Medicaid Other | Admitting: Adult Health

## 2019-05-19 ENCOUNTER — Ambulatory Visit: Payer: 59 | Attending: Internal Medicine

## 2019-05-19 ENCOUNTER — Other Ambulatory Visit: Payer: Self-pay

## 2019-05-19 DIAGNOSIS — Z20822 Contact with and (suspected) exposure to covid-19: Secondary | ICD-10-CM

## 2019-05-20 LAB — NOVEL CORONAVIRUS, NAA: SARS-CoV-2, NAA: NOT DETECTED

## 2019-05-20 LAB — SARS-COV-2, NAA 2 DAY TAT

## 2019-06-01 ENCOUNTER — Other Ambulatory Visit: Payer: Medicaid Other | Admitting: Adult Health

## 2019-06-22 ENCOUNTER — Other Ambulatory Visit (HOSPITAL_COMMUNITY)
Admission: RE | Admit: 2019-06-22 | Discharge: 2019-06-22 | Disposition: A | Discharge status: Home / Self Care | Payer: 59 | Source: Ambulatory Visit | Attending: Adult Health | Admitting: Adult Health

## 2019-06-22 ENCOUNTER — Encounter: Payer: Self-pay | Admitting: Adult Health

## 2019-06-22 ENCOUNTER — Ambulatory Visit (INDEPENDENT_AMBULATORY_CARE_PROVIDER_SITE_OTHER): Payer: Medicaid Other | Admitting: Adult Health

## 2019-06-22 VITALS — BP 110/75 | HR 71 | Ht 67.0 in | Wt 146.0 lb

## 2019-06-22 DIAGNOSIS — Z0000 Encounter for general adult medical examination without abnormal findings: Secondary | ICD-10-CM

## 2019-06-22 DIAGNOSIS — Z975 Presence of (intrauterine) contraceptive device: Secondary | ICD-10-CM | POA: Diagnosis not present

## 2019-06-22 DIAGNOSIS — Z01419 Encounter for gynecological examination (general) (routine) without abnormal findings: Secondary | ICD-10-CM | POA: Insufficient documentation

## 2019-06-22 NOTE — Progress Notes (Signed)
Patient ID: Cheryl Hebert, female   DOB: [birth year], 29 y.o.   MRN: [id number] History of Present Illness: Cheryl Hebert is a 29 year old black female, single, [id number], in for well woman gyn exam and pap. Has IUD.    Current Medications, Allergies, Past Medical History, Past Surgical History, Family History and Social History were reviewed in [employer] record.     Review of Systems: Patient denies any headaches, hearing loss, fatigue, blurred vision, shortness of breath, chest pain, abdominal pain, problems with bowel movements, urination, or intercourse(not currently active). No joint pain or mood swings. Has itching at times, hx BV   Physical Exam:BP 110/75 (BP Location: Right Arm, Patient Position: Sitting, Cuff Size: Normal)   Pulse 71   Ht 5\' 7"  (1.702 m)   Wt 146 lb (66.2 kg)   BMI 22.87 kg/m  General:  Well developed, well nourished, no acute distress Skin:  Warm and dry Neck:  Midline trachea, normal thyroid, good ROM, no lymphadenopathy Lungs; Clear to auscultation bilaterally Breast:  No dominant palpable mass, retraction, or nipple discharge,has bilateral nipple rods Cardiovascular: Regular rate and rhythm Abdomen:  Soft, non tender, no hepatosplenomegaly,has navel ring Pelvic:  External genitalia is normal in appearance, no lesions.  The vagina is normal in appearance. Urethra has no lesions or masses. The cervix is bulbous.+IUD strings at os, pap with high risk HPV 16/18 genotyping performed.  Uterus is felt to be normal size, shape, and contour.  No adnexal masses or tenderness noted.Bladder is non tender, no masses felt. Extremities/musculoskeletal:  No swelling or varicosities noted, no clubbing or cyanosis Psych:  No mood changes, alert and cooperative,seems happy AA 2  fall risk is low PHQ 9 score is 12, denies being depressed, no SI, but would like to go to counseling, number given for [facility] center in [place]. Examination  chaperoned by [place] LPN.  Impression and Plan: 1. Encounter for gynecological examination with Papanicolaou smear of cervix Pap sent Physical in 1 year Pap in 3 if normal Fasting labs at 30 Number given for [facility] counseling center    2. IUD (intrauterine device) in place

## 2019-06-24 LAB — CYTOLOGY - PAP
Comment: NEGATIVE
Diagnosis: NEGATIVE
High risk HPV: NEGATIVE

## 2019-08-24 ENCOUNTER — Telehealth: Payer: Self-pay | Admitting: Adult Health

## 2019-08-24 NOTE — Telephone Encounter (Signed)
Patient called stating that she just seen Victorino Dike for pap and physical and they discussed her having some discomfort and itching in her vaginal area. Pt states that she was suppose to call her something in but she didn't. Pt would like to know since [place] and her discussed this already if she could [facility] Please contact pt

## 2019-08-25 MED ORDER — METRONIDAZOLE 500 MG PO TABS
500.0000 mg | ORAL_TABLET | Freq: Two times a day (BID) | ORAL | 0 refills | Status: DC
Start: 2019-08-25 — End: 2020-01-18

## 2019-08-25 NOTE — Telephone Encounter (Signed)
Will rx flagyl  

## 2019-08-25 NOTE — Addendum Note (Signed)
Addended by: Cyril Mourning A on: 08/25/2019 08:14 AM   Modules accepted: Orders

## 2019-09-15 ENCOUNTER — Telehealth: Payer: Self-pay | Admitting: Adult Health

## 2019-09-15 NOTE — Telephone Encounter (Signed)
Patient called stating that she was placed on Medication from Bacterial infection and she would like to know if this medication can cause a yeast infection. Please contact pt

## 2019-09-16 MED ORDER — FLUCONAZOLE 150 MG PO TABS
ORAL_TABLET | ORAL | 1 refills | Status: DC
Start: 2019-09-16 — End: 2019-11-12

## 2019-09-16 NOTE — Telephone Encounter (Addendum)
Pt is on Flagyl and feels like she has a yeast infection. Can you send in med? Thanks!! JSY

## 2019-09-16 NOTE — Addendum Note (Signed)
Addended by: Cyril Mourning A on: 09/16/2019 01:49 PM   Modules accepted: Orders

## 2019-09-16 NOTE — Telephone Encounter (Signed)
rx diflucan  

## 2019-11-12 ENCOUNTER — Other Ambulatory Visit: Payer: Self-pay | Admitting: Women's Health

## 2019-11-12 ENCOUNTER — Telehealth: Payer: Self-pay | Admitting: Women's Health

## 2019-11-12 MED ORDER — FLUCONAZOLE 150 MG PO TABS: ORAL_TABLET | ORAL | 1 refills | Status: DC

## 2019-11-12 NOTE — Telephone Encounter (Signed)
Patient states she has vaginal itching and thinks it's from yeast infection would like for medication to be sent to pharmacy.

## 2020-01-18 ENCOUNTER — Telehealth: Payer: Self-pay | Admitting: Adult Health

## 2020-01-18 MED ORDER — METRONIDAZOLE 500 MG PO TABS: 500.0000 mg | ORAL_TABLET | Freq: Two times a day (BID) | ORAL | 0 refills | Status: DC

## 2020-01-18 NOTE — Telephone Encounter (Signed)
Left message that flagyl refilled

## 2020-01-18 NOTE — Addendum Note (Signed)
Addended by: Cyril Mourning A on: 01/18/2020 04:57 PM   Modules accepted: Orders

## 2020-01-18 NOTE — Telephone Encounter (Signed)
Patient is experiencing Bacterial Vaginosis symptoms and wants to know if a prescription can sent into her Pharmacy. Patient also thinks its due having an IUD. Patient has an appt to have it removed. Per patient. Clinical staff will follow up with patient.

## 2020-02-02 ENCOUNTER — Ambulatory Visit: Payer: Medicaid Other | Admitting: Adult Health

## 2020-05-19 ENCOUNTER — Other Ambulatory Visit (HOSPITAL_COMMUNITY)
Admission: RE | Admit: 2020-05-19 | Discharge: 2020-05-19 | Disposition: A | Discharge status: Home / Self Care | Payer: 59 | Source: Ambulatory Visit | Attending: Obstetrics & Gynecology | Admitting: Obstetrics & Gynecology

## 2020-05-19 ENCOUNTER — Other Ambulatory Visit: Payer: Self-pay

## 2020-05-19 ENCOUNTER — Other Ambulatory Visit (INDEPENDENT_AMBULATORY_CARE_PROVIDER_SITE_OTHER): Payer: 59 | Admitting: *Deleted

## 2020-05-19 DIAGNOSIS — N898 Other specified noninflammatory disorders of vagina: Secondary | ICD-10-CM | POA: Insufficient documentation

## 2020-05-19 NOTE — Progress Notes (Signed)
Chart reviewed for nurse visit. Agree with plan of care.  Panagiotis Oelkers A, NP 05/19/2020 5:06 PM  

## 2020-05-19 NOTE — Progress Notes (Signed)
   NURSE VISIT- VAGINITIS/STD  SUBJECTIVE:  Cheryl Hebert is a 30 y.o. [id number] GYN patientfemale here for a vaginal swab for vaginitis screening, STD screen.  She reports the following symptoms: vaginal itching and discharge with odor for 2 days. Denies abnormal vaginal bleeding, significant pelvic pain, fever, or UTI symptoms.  OBJECTIVE:  There were no vitals taken for this visit.  Appears well, in no apparent distress  ASSESSMENT: Vaginal swab for vaginitis screening & STD screen PLAN: Self-collected vaginal probe for Gonorrhea, Chlamydia, Trichomonas, Bacterial Vaginosis, Yeast sent to lab Treatment: to be determined once results are received Follow-up as needed if symptoms persist/worsen, or new symptoms develop  Malachy Mood  05/19/2020 3:30 PM

## 2020-05-23 ENCOUNTER — Other Ambulatory Visit: Payer: Self-pay | Admitting: Adult Health

## 2020-05-23 LAB — CERVICOVAGINAL ANCILLARY ONLY
Bacterial Vaginitis (gardnerella): POSITIVE — AB
Candida Glabrata: NEGATIVE
Candida Vaginitis: POSITIVE — AB
Chlamydia: NEGATIVE
Comment: NEGATIVE
Comment: NEGATIVE
Comment: NEGATIVE
Comment: NEGATIVE
Comment: NEGATIVE
Comment: NORMAL
Neisseria Gonorrhea: NEGATIVE
Trichomonas: NEGATIVE

## 2020-05-23 MED ORDER — METRONIDAZOLE 500 MG PO TABS
500.0000 mg | ORAL_TABLET | Freq: Two times a day (BID) | ORAL | 0 refills | Status: DC
Start: 2020-05-23 — End: 2020-07-12

## 2020-05-23 MED ORDER — FLUCONAZOLE 150 MG PO TABS
ORAL_TABLET | ORAL | 1 refills | Status: DC
Start: 2020-05-23 — End: 2020-07-12

## 2020-05-23 NOTE — Progress Notes (Signed)
+  BV and yeast on CV swab will rx flagyl and diflucan  

## 2020-07-04 ENCOUNTER — Other Ambulatory Visit: Payer: 59 | Admitting: Adult Health

## 2020-07-12 ENCOUNTER — Other Ambulatory Visit: Payer: Self-pay

## 2020-07-12 ENCOUNTER — Other Ambulatory Visit (HOSPITAL_COMMUNITY)
Admission: RE | Admit: 2020-07-12 | Discharge: 2020-07-12 | Disposition: A | Discharge status: Home / Self Care | Payer: Medicaid Other | Source: Ambulatory Visit | Attending: Obstetrics & Gynecology | Admitting: Obstetrics & Gynecology

## 2020-07-12 ENCOUNTER — Other Ambulatory Visit (INDEPENDENT_AMBULATORY_CARE_PROVIDER_SITE_OTHER): Payer: Medicaid Other

## 2020-07-12 DIAGNOSIS — N898 Other specified noninflammatory disorders of vagina: Secondary | ICD-10-CM | POA: Insufficient documentation

## 2020-07-12 NOTE — Progress Notes (Signed)
Patient ID: Cheryl Hebert, female   DOB: 12/22/1990, 30 y.o.   MRN: [id number] Chart reviewed for nurse visit. Agree with plan of care.  Adline Potter, NP 07/12/2020 5:04 PM

## 2020-07-12 NOTE — Progress Notes (Signed)
   NURSE VISIT- VAGINITIS/STD  SUBJECTIVE:  Cheryl Hebert is a 30 y.o. [id number] GYN patientfemale here for a vaginal swab for vaginitis screening, STD screen.  She reports the following symptoms: discharge described as white and odor for 1 week.  Took refill of Diflucan which helped with itching but still having discharge with odor.  Denies abnormal vaginal bleeding, significant pelvic pain, fever, or UTI symptoms.  OBJECTIVE:  There were no vitals taken for this visit.  Appears well, in no apparent distress  ASSESSMENT: Vaginal swab for vaginitis screening, std screen  PLAN: Self-collected vaginal probe for Gonorrhea, Chlamydia, Trichomonas, Bacterial Vaginosis, Yeast sent to lab Treatment: to be determined once results are received Follow-up as needed if symptoms persist/worsen, or new symptoms develop  Jobe Marker  07/12/2020 3:51 PM

## 2020-07-14 ENCOUNTER — Other Ambulatory Visit: Payer: Self-pay | Admitting: Adult Health

## 2020-07-14 LAB — CERVICOVAGINAL ANCILLARY ONLY
Bacterial Vaginitis (gardnerella): POSITIVE — AB
Candida Glabrata: NEGATIVE
Candida Vaginitis: NEGATIVE
Chlamydia: NEGATIVE
Comment: NEGATIVE
Comment: NEGATIVE
Comment: NEGATIVE
Comment: NEGATIVE
Comment: NEGATIVE
Comment: NORMAL
Neisseria Gonorrhea: NEGATIVE
Trichomonas: NEGATIVE

## 2020-07-14 MED ORDER — METRONIDAZOLE 500 MG PO TABS
500.0000 mg | ORAL_TABLET | Freq: Two times a day (BID) | ORAL | 0 refills | Status: DC
Start: 2020-07-14 — End: 2020-10-13

## 2020-07-14 NOTE — Progress Notes (Signed)
+  BV on vaginal swab will rx flagyl 

## 2020-07-22 ENCOUNTER — Other Ambulatory Visit: Payer: Medicaid Other | Admitting: Advanced Practice Midwife

## 2020-09-21 ENCOUNTER — Ambulatory Visit: Payer: Medicaid Other | Admitting: Adult Health

## 2020-10-13 ENCOUNTER — Ambulatory Visit (INDEPENDENT_AMBULATORY_CARE_PROVIDER_SITE_OTHER): Payer: Medicaid Other | Admitting: Advanced Practice Midwife

## 2020-10-13 ENCOUNTER — Other Ambulatory Visit: Payer: Self-pay

## 2020-10-13 ENCOUNTER — Other Ambulatory Visit (HOSPITAL_COMMUNITY)
Admission: RE | Admit: 2020-10-13 | Discharge: 2020-10-13 | Disposition: A | Discharge status: Home / Self Care | Payer: Medicaid Other | Source: Ambulatory Visit | Attending: Advanced Practice Midwife | Admitting: Advanced Practice Midwife

## 2020-10-13 ENCOUNTER — Encounter: Payer: Self-pay | Admitting: Advanced Practice Midwife

## 2020-10-13 VITALS — BP 113/79 | HR 69 | Ht 67.0 in | Wt 140.0 lb

## 2020-10-13 DIAGNOSIS — N898 Other specified noninflammatory disorders of vagina: Secondary | ICD-10-CM | POA: Insufficient documentation

## 2020-10-13 DIAGNOSIS — Z01419 Encounter for gynecological examination (general) (routine) without abnormal findings: Secondary | ICD-10-CM | POA: Insufficient documentation

## 2020-10-13 MED ORDER — CLEOCIN 100 MG VA SUPP: 100.0000 mg | Freq: Every day | VAGINAL | 0 refills | Status: DC

## 2020-10-13 NOTE — Progress Notes (Signed)
Cheryl Hebert 30 y.o.  Vitals:   10/13/20 0848  BP: 113/79  Pulse: 69     Filed Weights   10/13/20 0848  Weight: 140 lb (63.5 kg)    Past Medical History: Past Medical History:  Diagnosis Date   BV (bacterial vaginosis) 02/28/2015   Contraceptive management 02/28/2015   Gonorrhea    Pregnant 06/01/2015   Vaginal discharge 02/28/2015    Past Surgical History: Past Surgical History:  Procedure Laterality Date   KELOID EXCISION      Family History: Family History  Problem Relation Age of Onset   Asthma Brother    Cancer Maternal Grandfather    Diabetes Maternal Aunt     Social History: Social History   Tobacco Use   Smoking status: Never   Smokeless tobacco: Never  Vaping Use   Vaping Use: Never used  Substance Use Topics   Alcohol use: Yes    Comment: very rare   Drug use: No    Types: Marijuana    Allergies: No Known Allergies    Current Outpatient Medications:    levonorgestrel (MIRENA) 20 MCG/24HR IUD, 1 each by Intrauterine route once., Disp: , Rfl:   History of Present Illness: Here for pap and physical.  Pap 2020 ASCUS w/HPV, 2021 pap/HPV negative.  Gets frequent BV (some documented, others not)   Main sx are discharge and odor, although certainly has irritation w/some infections.  Amenorrheic on Mirena.     Review of Systems   Patient denies any headaches, blurred vision, shortness of breath, chest pain, abdominal pain, problems with bowel movements, urination, or intercourse.   Physical Exam: General:  Well developed, well nourished, no acute distress Skin:  Warm and dry Lungs; Clear to auscultation bilaterally Breast:  No dominant palpable mass, retraction, or nipple discharge Cardiovascular: Regular rate and rhythm Abdomen:  Soft, non tender, no hepatosplenomegaly Pelvic:  External genitalia is normal in appearance.  The vagina is normal in appearance.  Thin white discharge, no appreciable odor. The cervix is bulbous.  Strings  visible/palpable, Uterus is felt to be normal size, shape, and contour.  No adnexal masses or tenderness noted.  Extremities:  No swelling or varicosities noted Psych:  No mood changes.     Impression: Normal GYN exam Recurrent BV--Nuswab collected, rx cleocin ovules     Plan: Probiotics Cleocin ovules If pap normal. Can go to q 3 years per asccp 2019

## 2020-10-14 ENCOUNTER — Telehealth: Payer: Self-pay | Admitting: Advanced Practice Midwife

## 2020-10-14 LAB — CERVICOVAGINAL ANCILLARY ONLY
Bacterial Vaginitis (gardnerella): POSITIVE — AB
Candida Glabrata: NEGATIVE
Candida Vaginitis: NEGATIVE
Comment: NEGATIVE
Comment: NEGATIVE
Comment: NEGATIVE

## 2020-10-14 MED ORDER — METRONIDAZOLE 0.75 % VA GEL
1.0000 | Freq: Every day | VAGINAL | 0 refills | Status: DC
Start: 2020-10-14 — End: 2021-01-26

## 2020-10-14 NOTE — Telephone Encounter (Signed)
[facility] [place] [facility] medicine she got prescribed is on backorder and wanted to see if it could be sent to [facility] in [place].

## 2020-10-14 NOTE — Telephone Encounter (Signed)
Called Cheryl Hebert, clindamycin supp on back order will rx MetroGel

## 2020-10-19 LAB — CYTOLOGY - PAP
Chlamydia: NEGATIVE
Comment: NEGATIVE
Comment: NEGATIVE
Comment: NEGATIVE
Comment: NORMAL
Diagnosis: NEGATIVE
Diagnosis: REACTIVE
HPV 16: NEGATIVE
HPV 18 / 45: POSITIVE — AB
High risk HPV: POSITIVE — AB
Neisseria Gonorrhea: NEGATIVE

## 2020-11-01 ENCOUNTER — Telehealth: Payer: Self-pay | Admitting: Adult Health

## 2020-11-01 MED ORDER — FLUCONAZOLE 150 MG PO TABS: ORAL_TABLET | ORAL | 1 refills | Status: DC

## 2020-11-01 NOTE — Telephone Encounter (Signed)
Will rx diflucan  

## 2020-11-01 NOTE — Telephone Encounter (Signed)
Pt recently treated for BV, meds we ordered seemed to help but now pt is uncomfortable & itching that began a week after finishing the meds we ordered  Please advise & notify pt     Shirleen Schirmer

## 2020-11-01 NOTE — Telephone Encounter (Signed)
Called patient back she states that she has itching and white discharge after using metrogel. Would like diflucan sent to the pharmacy[facility]. I advised I would send a message to [facility] and she can check with her pharmacy later today for rx. No other questions at this time.

## 2020-11-04 ENCOUNTER — Other Ambulatory Visit (HOSPITAL_COMMUNITY)
Admission: RE | Admit: 2020-11-04 | Discharge: 2020-11-04 | Disposition: A | Discharge status: Home / Self Care | Payer: Medicaid Other | Source: Ambulatory Visit | Attending: Obstetrics & Gynecology | Admitting: Obstetrics & Gynecology

## 2020-11-04 ENCOUNTER — Encounter: Payer: Self-pay | Admitting: Obstetrics & Gynecology

## 2020-11-04 ENCOUNTER — Ambulatory Visit (INDEPENDENT_AMBULATORY_CARE_PROVIDER_SITE_OTHER): Payer: Medicaid Other | Admitting: Obstetrics & Gynecology

## 2020-11-04 ENCOUNTER — Other Ambulatory Visit: Payer: Self-pay

## 2020-11-04 VITALS — BP 113/70 | HR 64 | Ht 67.0 in | Wt 142.2 lb

## 2020-11-04 DIAGNOSIS — B977 Papillomavirus as the cause of diseases classified elsewhere: Secondary | ICD-10-CM

## 2020-11-04 DIAGNOSIS — N72 Inflammatory disease of cervix uteri: Secondary | ICD-10-CM

## 2020-11-04 DIAGNOSIS — Z975 Presence of (intrauterine) contraceptive device: Secondary | ICD-10-CM

## 2020-11-04 DIAGNOSIS — Z3202 Encounter for pregnancy test, result negative: Secondary | ICD-10-CM

## 2020-11-04 DIAGNOSIS — Z01812 Encounter for preprocedural laboratory examination: Secondary | ICD-10-CM

## 2020-11-04 DIAGNOSIS — N760 Acute vaginitis: Secondary | ICD-10-CM

## 2020-11-04 DIAGNOSIS — R87612 Low grade squamous intraepithelial lesion on cytologic smear of cervix (LGSIL): Secondary | ICD-10-CM | POA: Diagnosis not present

## 2020-11-04 LAB — POCT URINE PREGNANCY: Preg Test, Ur: NEGATIVE

## 2020-11-04 MED ORDER — METRONIDAZOLE 0.75 % VA GEL: 1.0000 | VAGINAL | 1 refills | Status: AC

## 2020-11-04 NOTE — Progress Notes (Signed)
    Patient name: Cheryl Hebert MRN [id number]  Date of birth: [birth year] Chief Complaint:   Colposcopy  History of Present Illness:   Cheryl Hebert is a 30 y.o. [phone]  female being seen today for the following concerns:  -Cervical dysplasia: 10/13/20- Pap normal, HPV 18/45 positive 2021- negative 2020- ASCUS, HPV pos  Smoker:  No.  Recurrent vaginitis:  States since placement of IUD,she notes frequent BV infection.  Recently treated with Metrogel then required Diflucan.  No LMP recorded (lmp unknown). (Menstrual status: IUD).  Depression screen [facility] 2/9 10/13/2020 06/22/2019 03/04/2018 12/10/2017  Decreased Interest 0 1 0 0  Down, Depressed, Hopeless 1 2 0 0  PHQ - 2 Score 1 3 0 0  Altered sleeping 1 3 - -  Tired, decreased energy 1 3 - -  Change in appetite 1 3 - -  Feeling bad or failure about yourself  1 0 - -  Trouble concentrating 0 0 - -  Moving slowly or fidgety/restless 0 0 - -  Suicidal thoughts 0 - - -  PHQ-9 Score 5 12 - -     Review of Systems:   Pertinent items are noted in HPI Denies fever/chills, dizziness, headaches, visual disturbances, fatigue, shortness of breath, chest pain, abdominal pain, vomiting, no problems with periods, bowel movements, urination, or intercourse unless otherwise stated above.  Pertinent History Reviewed:  Reviewed past medical,surgical, social, obstetrical and family history.  Reviewed problem list, medications and allergies. Physical Assessment:   Vitals:   11/04/20 1007  BP: 113/70  Pulse: 64  Weight: 142 lb 3.2 oz (64.5 kg)  Height: 5\' 7"  (1.702 m)  Body mass index is 22.27 kg/m.       Physical Examination:   General appearance: alert, well appearing, and in no distress  Psych: mood appropriate, normal affect  Skin: warm & dry   Cardiovascular: normal heart rate noted  Respiratory: normal respiratory effort, no distress  Abdomen: soft, non-tender   Pelvic: normal external genitalia, vulva, vagina, cervix- strings  visualized- see below regarding colposcopy  Extremities: no edema   Chaperone: Angel Neas     Colposcopy Procedure Note  Indications: HPV 18/45 positive   Procedure Details  The risks and benefits of the procedure and Written informed consent obtained.  Speculum placed in vagina and excellent visualization of cervix achieved, cervix swabbed x 3 with acetic acid solution.  Findings: Adequate colposcopy is noted today, TMZ appreciated  Cervix: + mosaicism; cervix swabbed with Lugol's solution, endocervical curettage performed, and cervical biopsies taken at 7 and 1 o'clock.  Specimens: ECC and cervical biopsies obtained  Complications: none.  Colposcopic Impression: CIN 1   Assessment/Plan:  -Cervical dysplasia Reviewed HPV and its ability to potentially lead to cervical cancer. Discussed today's procedure , see above -biopsy obtained further management pending results -reviewed post-procedure care  -Recurrent BV Discussed probiotics and reviewed pH imbalance Metrogel to use weekly x 2-3 mos  F/U if no improvement  -IUD in proper location  Meds ordered this encounter  Medications   metroNIDAZOLE (METROGEL VAGINAL) 0.75 % vaginal gel    Sig: Place 1 Applicatorful vaginally once a week for 10 days.    Dispense:  70 g    Refill:  1

## 2020-11-08 LAB — SURGICAL PATHOLOGY

## 2020-12-07 DIAGNOSIS — J0101 Acute recurrent maxillary sinusitis: Secondary | ICD-10-CM | POA: Diagnosis not present

## 2020-12-07 DIAGNOSIS — Z20828 Contact with and (suspected) exposure to other viral communicable diseases: Secondary | ICD-10-CM | POA: Diagnosis not present

## 2021-01-26 ENCOUNTER — Telehealth: Payer: Self-pay | Admitting: Adult Health

## 2021-01-26 ENCOUNTER — Other Ambulatory Visit: Payer: Self-pay | Admitting: Adult Health

## 2021-01-26 MED ORDER — METRONIDAZOLE 0.75 % VA GEL: 1.0000 | Freq: Every day | VAGINAL | 1 refills | Status: DC

## 2021-01-26 NOTE — Telephone Encounter (Signed)
Patient has had the mirena iud for about 5 years. She called concerned on whether the iud could start causing repeat bv infections and was wanting to possibly change to a different brand of iud. Please advise.

## 2021-01-26 NOTE — Progress Notes (Signed)
Refilled metrogel 

## 2021-03-29 ENCOUNTER — Telehealth: Payer: Self-pay | Admitting: Adult Health

## 2021-03-29 NOTE — Telephone Encounter (Signed)
Patient called stating that she has continuous bacterial vaginosis after she had an IUD placed. Patient states that she has tried everything Victorino Dike has told her to take and do but the problem still continues. Patient would like a call back from [employer] to see what else she can do or what her next step is. Please contact pt ?

## 2021-04-07 ENCOUNTER — Ambulatory Visit: Payer: Medicaid Other | Admitting: Adult Health

## 2021-05-26 ENCOUNTER — Ambulatory Visit: Payer: Medicaid Other | Admitting: Adult Health

## 2021-06-02 DIAGNOSIS — R059 Cough, unspecified: Secondary | ICD-10-CM | POA: Diagnosis not present

## 2021-06-02 DIAGNOSIS — J0101 Acute recurrent maxillary sinusitis: Secondary | ICD-10-CM | POA: Diagnosis not present

## 2021-06-02 DIAGNOSIS — I10 Essential (primary) hypertension: Secondary | ICD-10-CM | POA: Diagnosis not present

## 2021-07-27 ENCOUNTER — Ambulatory Visit (INDEPENDENT_AMBULATORY_CARE_PROVIDER_SITE_OTHER): Payer: No Typology Code available for payment source | Admitting: Adult Health

## 2021-07-27 ENCOUNTER — Encounter: Payer: Self-pay | Admitting: Adult Health

## 2021-07-27 ENCOUNTER — Other Ambulatory Visit (HOSPITAL_COMMUNITY)
Admission: RE | Admit: 2021-07-27 | Discharge: 2021-07-27 | Disposition: A | Discharge status: Home / Self Care | Payer: No Typology Code available for payment source | Source: Ambulatory Visit | Attending: Adult Health | Admitting: Adult Health

## 2021-07-27 VITALS — BP 114/76 | HR 68 | Ht 67.0 in | Wt 148.5 lb

## 2021-07-27 DIAGNOSIS — N72 Inflammatory disease of cervix uteri: Secondary | ICD-10-CM | POA: Insufficient documentation

## 2021-07-27 DIAGNOSIS — Z3202 Encounter for pregnancy test, result negative: Secondary | ICD-10-CM | POA: Diagnosis not present

## 2021-07-27 DIAGNOSIS — Z113 Encounter for screening for infections with a predominantly sexual mode of transmission: Secondary | ICD-10-CM

## 2021-07-27 DIAGNOSIS — A749 Chlamydial infection, unspecified: Secondary | ICD-10-CM | POA: Insufficient documentation

## 2021-07-27 DIAGNOSIS — L910 Hypertrophic scar: Secondary | ICD-10-CM | POA: Diagnosis not present

## 2021-07-27 DIAGNOSIS — Z975 Presence of (intrauterine) contraceptive device: Secondary | ICD-10-CM | POA: Diagnosis not present

## 2021-07-27 DIAGNOSIS — N921 Excessive and frequent menstruation with irregular cycle: Secondary | ICD-10-CM | POA: Insufficient documentation

## 2021-07-27 LAB — POCT URINE PREGNANCY: Preg Test, Ur: NEGATIVE

## 2021-07-27 MED ORDER — METRONIDAZOLE 0.75 % VA GEL
1.0000 | Freq: Every day | VAGINAL | 0 refills | Status: DC
Start: 2021-07-27 — End: 2021-10-17

## 2021-07-27 NOTE — Progress Notes (Signed)
  Subjective:     Patient ID: Cheryl Hebert, female   DOB: May 30, 1990, 31 y.o.   MRN: [id number]  HPI [name] is a 31 year old black female,single, [id number], in complaining of some irregular bleeding she has an IUD. And she wants breast exam.  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 Reviewed medications and allergies.  Objective:   Physical Exam BP 114/76 (BP Location: Left Arm, Patient Position: Sitting, Cuff Size: Normal)   Pulse 68   Ht 5\' 7"  (1.702 m)   Wt 148 lb 8 oz (67.4 kg)   BMI 23.26 kg/m  UPT is negative  Skin warm and dry.Pelvic: external genitalia is normal in appearance no lesions, vagina: mucous discharge without odor,urethra has no lesions or masses noted, cervix is bulbous, irritated at os and easily bleeds when touched,+IUD strings at os, uterus: normal size, shape and contour, non tender, no masses felt, adnexa: no masses or tenderness noted. Bladder is non tender and no masses felt. CV swab obtained.  Breasts:no dominate palpable mass, retraction or nipple discharge, has bilateral nipple rods and on left breast on edge of areola at 6 0' clock as keloid. Fall risk is low  Upstream - 07/27/21 1130       Pregnancy Intention Screening   Does the patient want to become pregnant in the next year? No    Does the patient's partner want to become pregnant in the next year? No    Would the patient like to discuss contraceptive options today? No      Contraception Wrap Up   Current Method IUD or IUS    End Method IUD or IUS               Examination chaperoned by 07/29/21 LPN  Assessment:     1. Pregnancy examination  or test, negative result  2. Irregular intermenstrual bleeding It has resolved CV swab sent   3. IUD (intrauterine device) in place Mirena placed 03/02/16  4. Cervicitis Will rx Metrogel Meds ordered this encounter  Medications   metroNIDAZOLE (METROGEL VAGINAL) 0.75 % vaginal gel    Sig: Place 1 Applicatorful vaginally at bedtime.    Dispense:  70 g    Refill:  0    Order Specific Question:   Supervising Provider    Answer:   EURE, LUTHER H [[id number]]     5. Keloid Leave alone, will follow   6. Screening examination for STD (sexually transmitted disease) CV swab sent for GC/CHL,trich and BV and yeast    She declines blood  labs. Plan:     Return in October for pap and physical

## 2021-07-28 ENCOUNTER — Other Ambulatory Visit: Payer: Self-pay | Admitting: Adult Health

## 2021-07-28 ENCOUNTER — Encounter: Payer: Self-pay | Admitting: Adult Health

## 2021-07-28 DIAGNOSIS — A749 Chlamydial infection, unspecified: Secondary | ICD-10-CM

## 2021-07-28 HISTORY — DX: Chlamydial infection, unspecified: A74.9

## 2021-07-28 LAB — CERVICOVAGINAL ANCILLARY ONLY
Bacterial Vaginitis (gardnerella): NEGATIVE
Candida Glabrata: NEGATIVE
Candida Vaginitis: NEGATIVE
Chlamydia: POSITIVE — AB
Comment: NEGATIVE
Comment: NEGATIVE
Comment: NEGATIVE
Comment: NEGATIVE
Comment: NEGATIVE
Comment: NORMAL
Neisseria Gonorrhea: NEGATIVE
Trichomonas: NEGATIVE

## 2021-07-28 MED ORDER — DOXYCYCLINE HYCLATE 100 MG PO TABS: 100.0000 mg | ORAL_TABLET | Freq: Two times a day (BID) | ORAL | 0 refills | Status: DC

## 2021-07-28 NOTE — Progress Notes (Signed)
+  chlamydia on vaginal swab will rx doxycycline 100 mg 1 bid x 7 days, no sex and will get Proof of cure in about 4 weeks, your partner needs to be treated too. He can see his doctor or health dept or I can treat will need name, DOb, allergies and drug store. [facility] sent

## 2021-08-17 DIAGNOSIS — Z6824 Body mass index (BMI) 24.0-24.9, adult: Secondary | ICD-10-CM | POA: Diagnosis not present

## 2021-08-17 DIAGNOSIS — R109 Unspecified abdominal pain: Secondary | ICD-10-CM | POA: Diagnosis not present

## 2021-08-17 DIAGNOSIS — R030 Elevated blood-pressure reading, without diagnosis of hypertension: Secondary | ICD-10-CM | POA: Diagnosis not present

## 2021-08-17 DIAGNOSIS — R300 Dysuria: Secondary | ICD-10-CM | POA: Diagnosis not present

## 2021-09-20 DIAGNOSIS — Z6823 Body mass index (BMI) 23.0-23.9, adult: Secondary | ICD-10-CM | POA: Diagnosis not present

## 2021-09-20 DIAGNOSIS — R059 Cough, unspecified: Secondary | ICD-10-CM | POA: Diagnosis not present

## 2021-09-20 DIAGNOSIS — J069 Acute upper respiratory infection, unspecified: Secondary | ICD-10-CM | POA: Diagnosis not present

## 2021-09-20 DIAGNOSIS — Z20828 Contact with and (suspected) exposure to other viral communicable diseases: Secondary | ICD-10-CM | POA: Diagnosis not present

## 2021-10-17 ENCOUNTER — Other Ambulatory Visit (HOSPITAL_COMMUNITY)
Admission: RE | Admit: 2021-10-17 | Discharge: 2021-10-17 | Disposition: A | Discharge status: Home / Self Care | Payer: No Typology Code available for payment source | Source: Ambulatory Visit | Attending: Adult Health | Admitting: Adult Health

## 2021-10-17 ENCOUNTER — Ambulatory Visit (INDEPENDENT_AMBULATORY_CARE_PROVIDER_SITE_OTHER): Payer: No Typology Code available for payment source | Admitting: Adult Health

## 2021-10-17 ENCOUNTER — Encounter: Payer: Self-pay | Admitting: Adult Health

## 2021-10-17 VITALS — BP 119/76 | HR 73 | Ht 67.0 in | Wt 159.0 lb

## 2021-10-17 DIAGNOSIS — N72 Inflammatory disease of cervix uteri: Secondary | ICD-10-CM

## 2021-10-17 DIAGNOSIS — B977 Papillomavirus as the cause of diseases classified elsewhere: Secondary | ICD-10-CM | POA: Diagnosis not present

## 2021-10-17 DIAGNOSIS — Z975 Presence of (intrauterine) contraceptive device: Secondary | ICD-10-CM

## 2021-10-17 DIAGNOSIS — Z01419 Encounter for gynecological examination (general) (routine) without abnormal findings: Secondary | ICD-10-CM | POA: Diagnosis not present

## 2021-10-17 NOTE — Progress Notes (Signed)
Patient ID: Cheryl Hebert, female   DOB: 02-15-1990, 31 y.o.   MRN: [id number] History of Present Illness: Prisma is a 31 year old black female,single, [id number], in for a well woman gyn exam and pap. 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   Current Medications, Allergies, Past Medical History, Past Surgical History, Family History and Social History were reviewed in [employer] record.     Review of Systems: Patient denies any headaches, hearing loss, fatigue, blurred vision, shortness of breath, chest pain, abdominal pain, problems with bowel movements, urination, or intercourse. No joint pain or mood swings.  +spotting some with IUD Denies depression, just overwhelmed at times   Physical Exam:BP 119/76 (BP Location: Left Arm, Patient Position: Sitting, Cuff Size: Normal)   Pulse 73   Ht 5\' 7"  (1.702 m)   Wt 159 lb (72.1 kg)   BMI 24.90 kg/m   General:  Well developed, well nourished, no acute distress Skin:  Warm and dry Neck:  Midline trachea, normal thyroid, good ROM, no lymphadenopathy Lungs; Clear to auscultation bilaterally Breast:  No dominant palpable mass, retraction, or nipple discharge,has bilateral nipple rods She has keloids on chest and at navel Cardiovascular: Regular rate and rhythm Abdomen:  Soft, non tender, no hepatosplenomegaly Pelvic:  External genitalia is normal in appearance, no lesions.  The vagina is normal in appearance,+blood. Urethra has no lesions or masses. The cervix is bulbous.+IUD strings at os, pap with GC/CHL and HR HPV genotyping performed.  Uterus is felt to be normal size, shape, and contour.  No adnexal masses or tenderness noted.Bladder is non tender, no masses felt. Extremities/musculoskeletal:  No swelling or varicosities noted, no clubbing or cyanosis Psych:  No mood changes, alert and cooperative,seems happy AA is 3 Fall risk is low    10/17/2021    8:44 AM  10/13/2020    8:46 AM 06/22/2019    3:40 PM  Depression screen PHQ 2/9  Decreased Interest 1 0 1  Down, Depressed, Hopeless 1 1 2   PHQ - 2 Score 2 1 3   Altered sleeping 3 1 3   Tired, decreased energy 3 1 3   Change in appetite 0 1 3  Feeling bad or failure about yourself  2 1 0  Trouble concentrating 0 0 0  Moving slowly or fidgety/restless 0 0 0  Suicidal thoughts 0 0   PHQ-9 Score 10 5 12    She declines meds     10/17/2021    8:44 AM 10/13/2020    8:46 AM 06/22/2019    3:43 PM 06/22/2019    3:41 PM  GAD 7 : Generalized Anxiety Score  Nervous, Anxious, on Edge 1 1 1 1   Control/stop worrying 1 1 2 2   Worry too much - different things 1 1 3 3   Trouble relaxing 1 0 1 1  Restless 0 0 0 0  Easily annoyed or irritable 2 1 3 3   Afraid - awful might happen 0 0 1 1  Total GAD 7 Score 6 4 11 11       Upstream - 10/17/21 0837       Pregnancy Intention Screening   Does the patient want to become pregnant in the next year? No    Does the patient's partner want to become pregnant in the next year? No    Would the patient like to discuss contraceptive options today? No      Contraception Wrap Up  Current Method IUD or IUS    End Method IUD or IUS             Examination chaperoned by Levy Pupa LPN  Impression and Plan: 1. Encounter for gynecological examination with Papanicolaou smear of cervix Pap sent Pap in 3 years if normal Physical in 1 year She declines labs today   2. IUD (intrauterine device) in place Mirena placed 03/02/16  3. High risk human papilloma virus (HPV) infection of cervix Pap sent

## 2021-10-19 LAB — CYTOLOGY - PAP
Chlamydia: NEGATIVE
Comment: NEGATIVE
Comment: NEGATIVE
Comment: NORMAL
Diagnosis: NEGATIVE
High risk HPV: NEGATIVE
Neisseria Gonorrhea: NEGATIVE

## 2021-11-18 DIAGNOSIS — J209 Acute bronchitis, unspecified: Secondary | ICD-10-CM | POA: Diagnosis not present

## 2021-11-18 DIAGNOSIS — Z6824 Body mass index (BMI) 24.0-24.9, adult: Secondary | ICD-10-CM | POA: Diagnosis not present

## 2022-03-06 ENCOUNTER — Other Ambulatory Visit (HOSPITAL_COMMUNITY)
Admission: RE | Admit: 2022-03-06 | Discharge: 2022-03-06 | Disposition: A | Discharge status: Home / Self Care | Payer: No Typology Code available for payment source | Source: Ambulatory Visit | Attending: Obstetrics & Gynecology | Admitting: Obstetrics & Gynecology

## 2022-03-06 ENCOUNTER — Other Ambulatory Visit (INDEPENDENT_AMBULATORY_CARE_PROVIDER_SITE_OTHER): Payer: No Typology Code available for payment source | Admitting: *Deleted

## 2022-03-06 DIAGNOSIS — Z113 Encounter for screening for infections with a predominantly sexual mode of transmission: Secondary | ICD-10-CM | POA: Insufficient documentation

## 2022-03-06 NOTE — Progress Notes (Signed)
   NURSE VISIT- VAGINITIS/STD/POC  SUBJECTIVE:  Cheryl Hebert is a 32 y.o. [id number] GYN patientfemale here for a vaginal swab for STD screen.  She reports the following symptoms: none for 0 days. Denies abnormal vaginal bleeding, significant pelvic pain, fever, or UTI symptoms.  OBJECTIVE:  There were no vitals taken for this visit.  Appears well, in no apparent distress  ASSESSMENT: Vaginal swab for STD screen  PLAN: Self-collected vaginal probe for Gonorrhea, Chlamydia, Trichomonas, Bacterial Vaginosis, Yeast sent to lab Treatment: to be determined once results are received Follow-up as needed if symptoms persist/worsen, or new symptoms develop  Janece Canterbury  03/06/2022 10:47 AM

## 2022-03-07 LAB — CERVICOVAGINAL ANCILLARY ONLY
Bacterial Vaginitis (gardnerella): NEGATIVE
Candida Glabrata: NEGATIVE
Candida Vaginitis: NEGATIVE
Chlamydia: NEGATIVE
Comment: NEGATIVE
Comment: NEGATIVE
Comment: NEGATIVE
Comment: NEGATIVE
Comment: NEGATIVE
Comment: NORMAL
Neisseria Gonorrhea: NEGATIVE
Trichomonas: NEGATIVE

## 2022-10-29 ENCOUNTER — Ambulatory Visit: Payer: 59 | Admitting: Internal Medicine

## 2022-12-17 ENCOUNTER — Ambulatory Visit: Payer: Self-pay | Admitting: Advanced Practice Midwife

## 2024-03-12 ENCOUNTER — Ambulatory Visit: Payer: Self-pay | Admitting: Women's Health
